# Patient Record
Sex: Female | Born: 1990 | Race: Black or African American | Hispanic: No | Marital: Single | Smoking: Former smoker
Health system: Southern US, Community
[De-identification: ages and names within clinical notes are randomized; demographics above are authoritative.]

## PROBLEM LIST (undated history)

## (undated) DIAGNOSIS — D649 Anemia, unspecified: Secondary | ICD-10-CM

## (undated) DIAGNOSIS — Z789 Other specified health status: Secondary | ICD-10-CM

## (undated) DIAGNOSIS — O99210 Obesity complicating pregnancy, unspecified trimester: Secondary | ICD-10-CM

## (undated) DIAGNOSIS — O30049 Twin pregnancy, dichorionic/diamniotic, unspecified trimester: Secondary | ICD-10-CM

## (undated) DIAGNOSIS — F1290 Cannabis use, unspecified, uncomplicated: Secondary | ICD-10-CM

## (undated) DIAGNOSIS — O99320 Drug use complicating pregnancy, unspecified trimester: Secondary | ICD-10-CM

## (undated) DIAGNOSIS — K219 Gastro-esophageal reflux disease without esophagitis: Secondary | ICD-10-CM

## (undated) DIAGNOSIS — A5901 Trichomonal vulvovaginitis: Secondary | ICD-10-CM

## (undated) DIAGNOSIS — Z98891 History of uterine scar from previous surgery: Secondary | ICD-10-CM

## (undated) HISTORY — PX: TONSILLECTOMY: SUR1361

## (undated) SURGERY — Surgical Case
Anesthesia: Spinal

---
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
## 2015-11-14 LAB — OB RESULTS CONSOLE HEPATITIS B SURFACE ANTIGEN: Hepatitis B Surface Ag: NEGATIVE

## 2015-11-14 LAB — OB RESULTS CONSOLE RUBELLA ANTIBODY, IGM: Rubella: IMMUNE

## 2015-11-14 LAB — OB RESULTS CONSOLE RPR: RPR: NONREACTIVE

## 2015-11-14 LAB — OB RESULTS CONSOLE VARICELLA ZOSTER ANTIBODY, IGG: VARICELLA IGG: IMMUNE

## 2016-02-13 LAB — OB RESULTS CONSOLE HIV ANTIBODY (ROUTINE TESTING): HIV: NONREACTIVE

## 2016-03-20 LAB — OB RESULTS CONSOLE GBS: GBS: POSITIVE

## 2016-04-08 ENCOUNTER — Encounter: Payer: Self-pay | Admitting: *Deleted

## 2016-04-08 ENCOUNTER — Observation Stay
Admission: EM | Admit: 2016-04-08 | Discharge: 2016-04-08 | Disposition: A | Discharge status: Home / Self Care | Payer: Medicaid Other | Attending: Obstetrics and Gynecology | Admitting: Obstetrics and Gynecology

## 2016-04-08 DIAGNOSIS — O479 False labor, unspecified: Secondary | ICD-10-CM

## 2016-04-08 DIAGNOSIS — Z3A40 40 weeks gestation of pregnancy: Secondary | ICD-10-CM | POA: Diagnosis not present

## 2016-04-08 DIAGNOSIS — O471 False labor at or after 37 completed weeks of gestation: Secondary | ICD-10-CM | POA: Diagnosis present

## 2016-04-08 HISTORY — DX: False labor, unspecified: O47.9

## 2016-04-08 NOTE — Discharge Summary (Signed)
Pt discharged to home with family in stable condition. Given d/c instructions on fetal kick counts and labor precautions. Verbalized understanding.

## 2016-04-08 NOTE — Final Progress Note (Signed)
Physician Final Progress Note  Patient ID: Jessica Davidson MRN: [id number] DOB/AGE: January 15, 1991 25 y.o.  Admit date: 04/08/2016 Admitting provider: Vena AustriaAndreas Jadence Kinlaw, MD Discharge date: 04/08/2016   Admission Diagnoses: irregular contractions  Discharge Diagnoses:  Active Problems:   Irregular contractions   Consults:none  Significant Findings/ Diagnostic Studies: none  Procedures: category I fetal tracing  Discharge Condition:  stable  Disposition: Final discharge disposition not confirmed  Diet: regular  Discharge Activity: no restrictions  Discharge Instructions    Discharge activity:  No Restrictions    Complete by:  As directed   Discharge diet:  No restrictions    Complete by:  As directed   Fetal Kick Count:  Lie on our left side for one hour after a meal, and count the number of times your baby kicks.  If it is less than 5 times, get up, move around and drink some juice.  Repeat the test 30 minutes later.  If it is still less than 5 kicks in an hour, notify your doctor.    Complete by:  As directed   LABOR:  When conractions begin, you should start to time them from the beginning of one contraction to the beginning  of the next.  When contractions are 5 - 10 minutes apart or less and have been regular for at least an hour, you should call your health care provider.    Complete by:  As directed   No sexual activity restrictions    Complete by:  As directed   Notify physician for bleeding from the vagina    Complete by:  As directed   Notify physician for blurring of vision or spots before the eyes    Complete by:  As directed   Notify physician for chills or fever    Complete by:  As directed   Notify physician for fainting spells, "black outs" or loss of consciousness    Complete by:  As directed   Notify physician for increase in vaginal discharge    Complete by:  As directed   Notify physician for leaking of fluid    Complete by:  As directed   Notify physician for pain  or burning when urinating    Complete by:  As directed   Notify physician for pelvic pressure (sudden increase)    Complete by:  As directed   Notify physician for severe or continued nausea or vomiting    Complete by:  As directed   Notify physician for sudden gushing of fluid from the vagina (with or without continued leaking)    Complete by:  As directed   Notify physician for sudden, constant, or occasional abdominal pain    Complete by:  As directed   Notify physician if baby moving less than usual    Complete by:  As directed       Medication List    TAKE these medications   multivitamin-prenatal 27-0.8 MG Tabs tablet Take 1 tablet by mouth daily at 12 noon.        Total time spent taking care of this patient:5 minutes  Signed: Lorrene ReidSTAEBLER, Dimond Crotty M 04/08/2016, 4:17 AM

## 2016-04-24 ENCOUNTER — Encounter: Payer: Self-pay | Admitting: *Deleted

## 2016-04-24 ENCOUNTER — Inpatient Hospital Stay
Admission: EM | Admit: 2016-04-24 | Discharge: 2016-04-29 | DRG: 765 | Disposition: A | Discharge status: Home / Self Care | Payer: Medicaid Other | Attending: Obstetrics and Gynecology | Admitting: Obstetrics and Gynecology

## 2016-04-24 DIAGNOSIS — Z8249 Family history of ischemic heart disease and other diseases of the circulatory system: Secondary | ICD-10-CM

## 2016-04-24 DIAGNOSIS — O480 Post-term pregnancy: Secondary | ICD-10-CM | POA: Diagnosis present

## 2016-04-24 DIAGNOSIS — D62 Acute posthemorrhagic anemia: Secondary | ICD-10-CM | POA: Diagnosis present

## 2016-04-24 DIAGNOSIS — Z9889 Other specified postprocedural states: Secondary | ICD-10-CM | POA: Diagnosis not present

## 2016-04-24 DIAGNOSIS — Z98891 History of uterine scar from previous surgery: Secondary | ICD-10-CM

## 2016-04-24 DIAGNOSIS — Z888 Allergy status to other drugs, medicaments and biological substances status: Secondary | ICD-10-CM

## 2016-04-24 DIAGNOSIS — O99824 Streptococcus B carrier state complicating childbirth: Secondary | ICD-10-CM | POA: Diagnosis present

## 2016-04-24 DIAGNOSIS — Z833 Family history of diabetes mellitus: Secondary | ICD-10-CM

## 2016-04-24 DIAGNOSIS — Z3A41 41 weeks gestation of pregnancy: Secondary | ICD-10-CM | POA: Diagnosis present

## 2016-04-24 DIAGNOSIS — O9081 Anemia of the puerperium: Secondary | ICD-10-CM | POA: Diagnosis present

## 2016-04-24 DIAGNOSIS — Z87891 Personal history of nicotine dependence: Secondary | ICD-10-CM

## 2016-04-24 HISTORY — DX: Other specified health status: Z78.9

## 2016-04-24 LAB — CBC
HEMATOCRIT: 32.9 % — AB (ref 35.0–47.0)
HEMOGLOBIN: 11.2 g/dL — AB (ref 12.0–16.0)
MCH: 28.1 pg (ref 26.0–34.0)
MCHC: 34.1 g/dL (ref 32.0–36.0)
MCV: 82.4 fL (ref 80.0–100.0)
Platelets: 269 10*3/uL (ref 150–440)
RBC: 3.99 MIL/uL (ref 3.80–5.20)
RDW: 13.7 % (ref 11.5–14.5)
WBC: 7.1 10*3/uL (ref 3.6–11.0)

## 2016-04-24 LAB — CHLAMYDIA/NGC RT PCR (ARMC ONLY)
Chlamydia Tr: NOT DETECTED
N gonorrhoeae: NOT DETECTED

## 2016-04-24 LAB — ABO/RH: ABO/RH(D): O POS

## 2016-04-24 LAB — ANTIBODY SCREEN: ANTIBODY SCREEN: NEGATIVE

## 2016-04-24 MED ORDER — LACTATED RINGERS IV SOLN: 500.0000 mL | INTRAVENOUS | Status: DC | PRN

## 2016-04-24 MED ORDER — SODIUM CHLORIDE 0.9 % IV SOLN
1.0000 g | INTRAVENOUS | Status: DC
  Administered 2016-04-25 (×5): 1 g via INTRAVENOUS
  Filled 2016-04-24 (×5): qty 1000

## 2016-04-24 MED ORDER — TERBUTALINE SULFATE 1 MG/ML IJ SOLN: 0.2500 mg | Freq: Once | INTRAMUSCULAR | Status: DC | PRN

## 2016-04-24 MED ORDER — AMMONIA AROMATIC IN INHA
RESPIRATORY_TRACT | Status: AC
  Filled 2016-04-24: qty 10

## 2016-04-24 MED ORDER — ZOLPIDEM TARTRATE 5 MG PO TABS
5.0000 mg | ORAL_TABLET | Freq: Every evening | ORAL | Status: DC | PRN
  Administered 2016-04-25: 5 mg via ORAL
  Filled 2016-04-24: qty 1

## 2016-04-24 MED ORDER — OXYTOCIN 40 UNITS IN LACTATED RINGERS INFUSION - SIMPLE MED: 2.5000 [IU]/h | INTRAVENOUS | Status: DC

## 2016-04-24 MED ORDER — ONDANSETRON HCL 4 MG/2ML IJ SOLN
4.0000 mg | Freq: Four times a day (QID) | INTRAMUSCULAR | Status: DC | PRN
  Administered 2016-04-26: 4 mg via INTRAVENOUS
  Filled 2016-04-24: qty 2

## 2016-04-24 MED ORDER — OXYTOCIN BOLUS FROM INFUSION: 500.0000 mL | Freq: Once | INTRAVENOUS | Status: DC

## 2016-04-24 MED ORDER — ACETAMINOPHEN 325 MG PO TABS: 650.0000 mg | ORAL_TABLET | ORAL | Status: DC | PRN

## 2016-04-24 MED ORDER — MISOPROSTOL 200 MCG PO TABS
ORAL_TABLET | ORAL | Status: AC
  Filled 2016-04-24: qty 4

## 2016-04-24 MED ORDER — LIDOCAINE HCL (PF) 1 % IJ SOLN
INTRAMUSCULAR | Status: AC
  Filled 2016-04-24: qty 30

## 2016-04-24 MED ORDER — BUTORPHANOL TARTRATE 1 MG/ML IJ SOLN
1.0000 mg | INTRAMUSCULAR | Status: DC | PRN
  Administered 2016-04-25 (×2): 1 mg via INTRAVENOUS
  Filled 2016-04-24 (×2): qty 1

## 2016-04-24 MED ORDER — DINOPROSTONE 10 MG VA INST
10.0000 mg | VAGINAL_INSERT | Freq: Once | VAGINAL | Status: AC
  Administered 2016-04-24: 10 mg via VAGINAL
  Filled 2016-04-24: qty 1

## 2016-04-24 MED ORDER — LACTATED RINGERS IV SOLN
INTRAVENOUS | Status: DC
  Administered 2016-04-24 – 2016-04-26 (×3): via INTRAVENOUS

## 2016-04-24 MED ORDER — OXYTOCIN 10 UNIT/ML IJ SOLN
INTRAMUSCULAR | Status: AC
  Filled 2016-04-24: qty 2

## 2016-04-24 MED ORDER — SODIUM CHLORIDE 0.9 % IV SOLN
2.0000 g | Freq: Once | INTRAVENOUS | Status: AC
  Administered 2016-04-24: 2 g via INTRAVENOUS
  Filled 2016-04-24: qty 2000

## 2016-04-24 NOTE — H&P (Signed)
Obstetric History and Physical  Jessica Davidson is a 25 y.o. G1P0 with Estimated Date of Delivery: 04/17/16  who presents at 7675w0d  presenting for postdates IOL. Patient states she has been having no contractions, no vaginal bleeding, intact membranes, with active fetal movement.    Prenatal Course Source of Care: [facility]  with onset of care at 17 weeks Pregnancy complications or risks: Late to care BMI 34  Patient Active Problem List   Diagnosis Date Noted  . Irregular contractions 04/08/2016   She plans to breastfeed She desires Nexplanon for postpartum contraception.   Prenatal labs and studies: ABO, Rh: O+  Antibody: Neg Rubella: Immune Varicella: Immune RPR:  NR HBsAg:  Neg HIV: Neg GC/CT: Neg/Neg GBS: Positive 1 hr Glucola: 139   Genetic screening: late to care  TDAP: UTD   Prenatal Transfer Tool   Past Medical History:  Diagnosis Date  . Medical history non-contributory     Past Surgical History:  Procedure Laterality Date  . TONSILLECTOMY      OB History  Gravida Para Term Preterm AB Living  1            SAB TAB Ectopic Multiple Live Births               # Outcome Date GA Lbr Len/2nd Weight Sex Delivery Anes PTL Lv  1 Current               Social History   Social History  . Marital status: Single    Spouse name: N/A  . Number of children: N/A  . Years of education: N/A   Social History Main Topics  . Smoking status: Former Smoker    Packs/day: 0.25  . Smokeless tobacco: Former NeurosurgeonUser  . Alcohol use No  . Drug use: No  . Sexual activity: No   Other Topics Concern  . None   Social History Narrative  . None    Family History  Problem Relation Age of Onset  . Diabetes Mother   . Hypertension Mother   . Hypertension Father     Prescriptions Prior to Admission  Medication Sig Dispense Refill Last Dose  . acetaminophen (TYLENOL) 500 MG tablet Take 500 mg by mouth every 6 (six) hours as needed.   Past Week at Unknown time  . Prenatal  Vit-Fe Fumarate-FA (MULTIVITAMIN-PRENATAL) 27-0.8 MG TABS tablet Take 1 tablet by mouth daily at 12 noon.   04/24/2016 at Unknown time    No Known Allergies  Review of Systems: Negative except for what is mentioned in HPI.  Physical Exam: BP 114/67 (BP Location: Left Arm)   Pulse 86   Temp 98 F (36.7 C) (Oral)   Resp 16   Ht 5\' 7"  (1.702 m)   Wt 248 lb (112.5 kg)   BMI 38.84 kg/m  GENERAL: Well-developed, well-nourished female in no acute distress.  LUNGS: Unlabored breathing ABDOMEN: Soft, nontender, nondistended, gravid. EXTREMITIES: Nontender, no edema Cervical Exam: Dilatation 1 cm   Effacement 20 %   Station -3   Presentation: cephalic FHT: Category: 1 Baseline rate 135 bpm   Variability moderate  Accelerations present   Decelerations none Contractions: irregular   Pertinent Labs/Studies:   No results found for this or any previous visit (from the past 24 hour(s)).  Assessment : IUP at 3475w0d IOL for postdates  Plan: Admit for labor and Antibiotics for GBS prophylaxis  Cervidil for cervical ripening

## 2016-04-25 MED ORDER — OXYTOCIN 40 UNITS IN LACTATED RINGERS INFUSION - SIMPLE MED
1.0000 m[IU]/min | INTRAVENOUS | Status: DC
  Administered 2016-04-25: 3 m[IU]/min via INTRAVENOUS

## 2016-04-25 MED ORDER — SODIUM CHLORIDE 0.9 % IV SOLN
INTRAVENOUS | Status: AC
  Filled 2016-04-25: qty 1000

## 2016-04-25 MED ORDER — TERBUTALINE SULFATE 1 MG/ML IJ SOLN: 0.2500 mg | Freq: Once | INTRAMUSCULAR | Status: DC | PRN

## 2016-04-25 MED ORDER — SODIUM CHLORIDE FLUSH 0.9 % IV SOLN
INTRAVENOUS | Status: AC
  Filled 2016-04-25: qty 10

## 2016-04-25 MED ORDER — SODIUM CHLORIDE 0.9 % IJ SOLN
INTRAMUSCULAR | Status: AC
  Filled 2016-04-25: qty 50

## 2016-04-25 MED ORDER — BUTORPHANOL TARTRATE 1 MG/ML IJ SOLN: 1.0000 mg | INTRAMUSCULAR | Status: DC | PRN

## 2016-04-25 MED ORDER — OXYTOCIN 40 UNITS IN LACTATED RINGERS INFUSION - SIMPLE MED
INTRAVENOUS | Status: AC
  Administered 2016-04-25: 1 m[IU]/min
  Filled 2016-04-25: qty 1000

## 2016-04-25 NOTE — Progress Notes (Signed)
Subjective:  Doing well, somewhat tearful this morning with no cervical change.  Is feeling some contractions  Objective:   Vitals: Blood pressure 110/68, pulse 81, temperature 97.9 F (36.6 C), temperature source Oral, resp. rate 20, height 5\' 7"  (1.702 m), weight 248 lb (112.5 kg). General:  Abdomen: Cervical Exam:  Dilation: 1 Effacement (%): Thick, 30, 40 Cervical Position: Posterior Station: -2 Presentation: Vertex Exam by:: JLM  FHT: 130, moderate, +accels, no decels Toco: q5-157min  Results for orders placed or performed during the hospital encounter of 04/24/16 (from the past 24 hour(s))  CBC     Status: Abnormal   Collection Time: 04/24/16  9:14 PM  Result Value Ref Range   WBC 7.1 3.6 - 11.0 K/uL   RBC 3.99 3.80 - 5.20 MIL/uL   Hemoglobin 11.2 (L) 12.0 - 16.0 g/dL   HCT 16.132.9 (L) 09.635.0 - 04.547.0 %   MCV 82.4 80.0 - 100.0 fL   MCH 28.1 26.0 - 34.0 pg   MCHC 34.1 32.0 - 36.0 g/dL   RDW 40.913.7 81.111.5 - 91.414.5 %   Platelets 269 150 - 440 K/uL  ABO/Rh     Status: None   Collection Time: 04/24/16  9:14 PM  Result Value Ref Range   ABO/RH(D) O POS   Antibody screen     Status: None   Collection Time: 04/24/16  9:14 PM  Result Value Ref Range   Antibody Screen NEG   Chlamydia/NGC rt PCR ([facility] only)     Status: None   Collection Time: 04/24/16  9:31 PM  Result Value Ref Range   Specimen source GC/Chlam URINE, RANDOM    Chlamydia Tr NOT DETECTED NOT DETECTED   N gonorrhoeae NOT DETECTED NOT DETECTED    Assessment:   25 y.o. G1P0 3672w1d IOL for postdates  Plan:   1) Labor - start pitocin this AM, will place foley once in contraction pattern.  May shower and eat prior to starting pitocin  2) Fetus - cat I tracing

## 2016-04-25 NOTE — Progress Notes (Signed)
[facility]  Cervidil removed at 9:50.  AMS letting [name] get up to shower and have a light breakfast.

## 2016-04-25 NOTE — Progress Notes (Signed)
Subjective:  Comfortable  Objective:   Vitals: Blood pressure 110/68, pulse 81, temperature 97.9 F (36.6 C), temperature source Oral, resp. rate 20, height 5\' 7"  (1.702 m), weight 248 lb (112.5 kg). General: Sleeping Abdomen: Gravid, non-tender Cervical Exam:  Dilation: 1 Effacement (%): Thick, 30, 40 Cervical Position: Posterior Station: -2 Presentation: Vertex Exam by:: JLM  FHT: 140, moderate, +accels, no decels Toco: intermittent  Results for orders placed or performed during the hospital encounter of 04/24/16 (from the past 24 hour(s))  CBC     Status: Abnormal   Collection Time: 04/24/16  9:14 PM  Result Value Ref Range   WBC 7.1 3.6 - 11.0 K/uL   RBC 3.99 3.80 - 5.20 MIL/uL   Hemoglobin 11.2 (L) 12.0 - 16.0 g/dL   HCT 45.432.9 (L) 09.835.0 - 11.947.0 %   MCV 82.4 80.0 - 100.0 fL   MCH 28.1 26.0 - 34.0 pg   MCHC 34.1 32.0 - 36.0 g/dL   RDW 14.713.7 82.911.5 - 56.214.5 %   Platelets 269 150 - 440 K/uL  ABO/Rh     Status: None   Collection Time: 04/24/16  9:14 PM  Result Value Ref Range   ABO/RH(D) O POS   Antibody screen     Status: None   Collection Time: 04/24/16  9:14 PM  Result Value Ref Range   Antibody Screen NEG   Chlamydia/NGC rt PCR ([facility] only)     Status: None   Collection Time: 04/24/16  9:31 PM  Result Value Ref Range   Specimen source GC/Chlam URINE, RANDOM    Chlamydia Tr NOT DETECTED NOT DETECTED   N gonorrhoeae NOT DETECTED NOT DETECTED    Assessment:   25 y.o. G1P0 5553w1d postdates IOL  Plan:   1) Labor - cervical foley placed, continue to titrate up on pitocin  2) Fetus - cat I tracing

## 2016-04-26 ENCOUNTER — Inpatient Hospital Stay: Payer: Medicaid Other | Admitting: Anesthesiology

## 2016-04-26 ENCOUNTER — Encounter
Admission: EM | Disposition: A | Discharge status: Home / Self Care | Payer: Self-pay | Source: Home / Self Care | Attending: Obstetrics and Gynecology

## 2016-04-26 LAB — RPR: RPR: NONREACTIVE

## 2016-04-26 LAB — PLATELET COUNT: PLATELETS: 232 10*3/uL (ref 150–440)

## 2016-04-26 SURGERY — Surgical Case
Anesthesia: Spinal | Wound class: Clean Contaminated

## 2016-04-26 MED ORDER — LACTATED RINGERS IV SOLN
INTRAVENOUS | Status: DC
  Administered 2016-04-26 – 2016-04-27 (×3): via INTRAVENOUS

## 2016-04-26 MED ORDER — TETANUS-DIPHTH-ACELL PERTUSSIS 5-2.5-18.5 LF-MCG/0.5 IM SUSP: 0.5000 mL | Freq: Once | INTRAMUSCULAR | Status: DC

## 2016-04-26 MED ORDER — IBUPROFEN 600 MG PO TABS
600.0000 mg | ORAL_TABLET | Freq: Four times a day (QID) | ORAL | Status: DC
  Administered 2016-04-27 – 2016-04-29 (×9): 600 mg via ORAL
  Filled 2016-04-26 (×10): qty 1

## 2016-04-26 MED ORDER — FENTANYL CITRATE (PF) 100 MCG/2ML IJ SOLN: 25.0000 ug | INTRAMUSCULAR | Status: DC | PRN

## 2016-04-26 MED ORDER — KETOROLAC TROMETHAMINE 30 MG/ML IJ SOLN
30.0000 mg | Freq: Four times a day (QID) | INTRAMUSCULAR | Status: AC | PRN
  Administered 2016-04-26 – 2016-04-27 (×2): 30 mg via INTRAVENOUS
  Filled 2016-04-26 (×2): qty 1

## 2016-04-26 MED ORDER — SOD CITRATE-CITRIC ACID 500-334 MG/5ML PO SOLN
ORAL | Status: AC
  Administered 2016-04-26: 30 mL via ORAL
  Filled 2016-04-26: qty 15

## 2016-04-26 MED ORDER — BUPIVACAINE HCL 0.5 % IJ SOLN: 50.0000 mL | Freq: Once | INTRAMUSCULAR | Status: DC

## 2016-04-26 MED ORDER — ONDANSETRON HCL 4 MG/2ML IJ SOLN
INTRAMUSCULAR | Status: DC | PRN
  Administered 2016-04-26: 4 mg via INTRAVENOUS

## 2016-04-26 MED ORDER — ONDANSETRON HCL 4 MG/2ML IJ SOLN: 4.0000 mg | Freq: Once | INTRAMUSCULAR | Status: DC | PRN

## 2016-04-26 MED ORDER — SIMETHICONE 80 MG PO CHEW: 80.0000 mg | CHEWABLE_TABLET | ORAL | Status: DC

## 2016-04-26 MED ORDER — SIMETHICONE 80 MG PO CHEW
80.0000 mg | CHEWABLE_TABLET | Freq: Three times a day (TID) | ORAL | Status: DC
  Administered 2016-04-26 – 2016-04-29 (×8): 80 mg via ORAL
  Filled 2016-04-26 (×8): qty 1

## 2016-04-26 MED ORDER — LACTATED RINGERS IV SOLN
INTRAVENOUS | Status: DC | PRN
  Administered 2016-04-26: 06:00:00 via INTRAVENOUS

## 2016-04-26 MED ORDER — CEFAZOLIN SODIUM-DEXTROSE 2-4 GM/100ML-% IV SOLN
INTRAVENOUS | Status: AC
  Administered 2016-04-26: 2 g via INTRAVENOUS
  Filled 2016-04-26: qty 100

## 2016-04-26 MED ORDER — PRENATAL MULTIVITAMIN CH
1.0000 | ORAL_TABLET | Freq: Every day | ORAL | Status: DC
  Administered 2016-04-26 – 2016-04-29 (×4): 1 via ORAL
  Filled 2016-04-26 (×4): qty 1

## 2016-04-26 MED ORDER — OXYCODONE-ACETAMINOPHEN 5-325 MG PO TABS
2.0000 | ORAL_TABLET | ORAL | Status: DC | PRN
Start: 2016-04-27 — End: 2016-04-29

## 2016-04-26 MED ORDER — PHENYLEPHRINE HCL 10 MG/ML IJ SOLN
INTRAMUSCULAR | Status: DC | PRN
  Administered 2016-04-26 (×3): 100 ug via INTRAVENOUS

## 2016-04-26 MED ORDER — WITCH HAZEL-GLYCERIN EX PADS: 1.0000 "application " | MEDICATED_PAD | CUTANEOUS | Status: DC | PRN

## 2016-04-26 MED ORDER — SENNOSIDES-DOCUSATE SODIUM 8.6-50 MG PO TABS
2.0000 | ORAL_TABLET | ORAL | Status: DC
Start: 2016-04-27 — End: 2016-04-29
  Administered 2016-04-27 – 2016-04-29 (×3): 2 via ORAL
  Filled 2016-04-26 (×3): qty 2

## 2016-04-26 MED ORDER — SIMETHICONE 80 MG PO CHEW: 80.0000 mg | CHEWABLE_TABLET | ORAL | Status: DC | PRN

## 2016-04-26 MED ORDER — ACETAMINOPHEN 325 MG PO TABS: 650.0000 mg | ORAL_TABLET | ORAL | Status: DC | PRN

## 2016-04-26 MED ORDER — MORPHINE SULFATE (PF) 0.5 MG/ML IJ SOLN
INTRAMUSCULAR | Status: DC | PRN
  Administered 2016-04-26: .2 mg via INTRATHECAL

## 2016-04-26 MED ORDER — OXYCODONE-ACETAMINOPHEN 5-325 MG PO TABS
1.0000 | ORAL_TABLET | ORAL | Status: DC | PRN
  Administered 2016-04-27: 1 via ORAL
  Filled 2016-04-26: qty 1

## 2016-04-26 MED ORDER — DIBUCAINE 1 % RE OINT: 1.0000 "application " | TOPICAL_OINTMENT | RECTAL | Status: DC | PRN

## 2016-04-26 MED ORDER — SOD CITRATE-CITRIC ACID 500-334 MG/5ML PO SOLN
30.0000 mL | ORAL | Status: AC
  Administered 2016-04-26: 30 mL via ORAL

## 2016-04-26 MED ORDER — OXYTOCIN 40 UNITS IN LACTATED RINGERS INFUSION - SIMPLE MED
INTRAVENOUS | Status: DC | PRN
  Administered 2016-04-26: 399 mL via INTRAVENOUS
  Administered 2016-04-26: 1 mL via INTRAVENOUS

## 2016-04-26 MED ORDER — DEXTROSE 5 % IV SOLN
500.0000 mg | INTRAVENOUS | Status: DC
  Administered 2016-04-26: 500 mg via INTRAVENOUS
  Filled 2016-04-26 (×2): qty 500

## 2016-04-26 MED ORDER — BUPIVACAINE 0.25 % ON-Q PUMP DUAL CATH 400 ML
400.0000 mL | INJECTION | Status: DC
  Filled 2016-04-26: qty 400

## 2016-04-26 MED ORDER — KETOROLAC TROMETHAMINE 30 MG/ML IJ SOLN: 30.0000 mg | Freq: Four times a day (QID) | INTRAMUSCULAR | Status: AC | PRN

## 2016-04-26 MED ORDER — BUPIVACAINE 0.5 % ON-Q PUMP DUAL CATH 300 ML
INJECTION | Status: DC | PRN
  Administered 2016-04-26: 10 mL

## 2016-04-26 MED ORDER — FENTANYL CITRATE (PF) 100 MCG/2ML IJ SOLN
INTRAMUSCULAR | Status: DC | PRN
  Administered 2016-04-26: 20 ug via INTRATHECAL

## 2016-04-26 MED ORDER — OXYTOCIN 40 UNITS IN LACTATED RINGERS INFUSION - SIMPLE MED: 2.5000 [IU]/h | INTRAVENOUS | Status: AC

## 2016-04-26 MED ORDER — COCONUT OIL OIL: 1.0000 "application " | TOPICAL_OIL | Status: DC | PRN

## 2016-04-26 MED ORDER — CEFAZOLIN SODIUM-DEXTROSE 2-4 GM/100ML-% IV SOLN
2.0000 g | INTRAVENOUS | Status: AC
  Administered 2016-04-26: 2 g via INTRAVENOUS

## 2016-04-26 MED ORDER — MENTHOL 3 MG MT LOZG
1.0000 | LOZENGE | OROMUCOSAL | Status: DC | PRN
  Filled 2016-04-26: qty 9

## 2016-04-26 MED ORDER — BUPIVACAINE IN DEXTROSE 0.75-8.25 % IT SOLN
INTRATHECAL | Status: DC | PRN
  Administered 2016-04-26: 1.6 mL via INTRATHECAL

## 2016-04-26 MED ORDER — DIPHENHYDRAMINE HCL 25 MG PO CAPS: 25.0000 mg | ORAL_CAPSULE | Freq: Four times a day (QID) | ORAL | Status: DC | PRN

## 2016-04-26 SURGICAL SUPPLY — 27 items
BAG COUNTER SPONGE EZ (MISCELLANEOUS) ×4 IMPLANT
CANISTER SUCT 3000ML (MISCELLANEOUS) ×3 IMPLANT
CATH KIT ON-Q SILVERSOAK 5IN (CATHETERS) ×6 IMPLANT
CHLORAPREP W/TINT 26ML (MISCELLANEOUS) ×6 IMPLANT
CLOSURE WOUND 1/2 X4 (GAUZE/BANDAGES/DRESSINGS) ×1
COUNTER SPONGE BAG EZ (MISCELLANEOUS) ×2
DRSG TELFA 3X8 NADH (GAUZE/BANDAGES/DRESSINGS) ×3 IMPLANT
ELECT CAUTERY BLADE 6.4 (BLADE) ×3 IMPLANT
ELECT REM PT RETURN 9FT ADLT (ELECTROSURGICAL) ×3
ELECTRODE REM PT RTRN 9FT ADLT (ELECTROSURGICAL) ×1 IMPLANT
GAUZE SPONGE 4X4 12PLY STRL (GAUZE/BANDAGES/DRESSINGS) ×3 IMPLANT
GLOVE BIO SURGEON STRL SZ7 (GLOVE) ×9 IMPLANT
GLOVE INDICATOR 7.5 STRL GRN (GLOVE) ×9 IMPLANT
GOWN STRL REUS W/ TWL LRG LVL3 (GOWN DISPOSABLE) ×3 IMPLANT
GOWN STRL REUS W/TWL LRG LVL3 (GOWN DISPOSABLE) ×6
LIQUID BAND (GAUZE/BANDAGES/DRESSINGS) ×9 IMPLANT
NS IRRIG 1000ML POUR BTL (IV SOLUTION) ×3 IMPLANT
PACK C SECTION AR (MISCELLANEOUS) ×3 IMPLANT
PAD OB MATERNITY 4.3X12.25 (PERSONAL CARE ITEMS) ×3 IMPLANT
PAD PREP 24X41 OB/GYN DISP (PERSONAL CARE ITEMS) ×3 IMPLANT
STAPLER INSORB 30 2030 C-SECTI (MISCELLANEOUS) ×3 IMPLANT
STRIP CLOSURE SKIN 1/2X4 (GAUZE/BANDAGES/DRESSINGS) ×2 IMPLANT
SUT MNCRL AB 4-0 PS2 18 (SUTURE) IMPLANT
SUT PDS AB 1 TP1 96 (SUTURE) ×6 IMPLANT
SUT VIC AB 0 CTX 36 (SUTURE) ×6
SUT VIC AB 0 CTX36XBRD ANBCTRL (SUTURE) ×3 IMPLANT
SUT VIC AB 2-0 CT1 36 (SUTURE) IMPLANT

## 2016-04-26 NOTE — Progress Notes (Signed)
Subjective:  Still feeling gas pain (these correlate with contractions)  Objective:   Vitals: Blood pressure (!) 96/46, pulse 69, temperature 97.9 F (36.6 C), temperature source Oral, resp. rate 20, height 5\' 7"  (1.702 m), weight 248 lb (112.5 kg). General: Uncomfortable with contractions Abdomen: gravid, non-tender Cervical Exam:  Dilation: 3 Effacement (%): 70 Cervical Position: Posterior Station: -3 Presentation: Vertex Exam by:: Treyten Monestime  FHT: 140, moderate variability, recurrent variables early decelerations Toco: q452min  No results found for this or any previous visit (from the past 24 hour(s)).  Assessment:   25 y.o. G1P0 4758w2d IOL postdates  Plan:   1) Labor - stop pitocin, amnioinfusion 500mL bolus over 1-hr, restart pitocin if reassuring monitoring - proceed with epidural placement  2) Fetus - cat II tracin

## 2016-04-26 NOTE — Progress Notes (Signed)
Subjective:    Objective:   Vitals: Blood pressure (!) 96/46, pulse 69, temperature 97.9 F (36.6 C), temperature source Oral, resp. rate 20, height 5\' 7"  (1.702 m), weight 248 lb (112.5 kg). General:  Abdomen: Cervical Exam:  Dilation: 3 Effacement (%): 70 Cervical Position: Posterior Station: -3 Presentation: Vertex Exam by:: Chigozie Basaldua  FHT: 150, moderate, no accels, variables with every contraction Toco: q557min  No results found for this or any previous visit (from the past 24 hour(s)).  Assessment:   25 y.o. G1P0 5959w2d IOL postdates with fetal intolerance to labor  Plan:   1) Labor - recurrent variable have not responded to position changes, amnio-infusion.  Spaced out with stopping pitocin but given early in labor course discussed implications and will proceed with primary c-section for delivery  2) Fetus - cat II tracing

## 2016-04-26 NOTE — Anesthesia Preprocedure Evaluation (Signed)
Anesthesia Evaluation  Patient identified by MRN, date of birth, ID band Patient awake    Reviewed: Allergy & Precautions, H&P , NPO status , Patient's Chart, lab work & pertinent test results, reviewed documented beta blocker date and time Preop documentation limited or incomplete due to emergent nature of procedure.  Airway Mallampati: II  TM Distance: >3 FB Neck ROM: full    Dental no notable dental hx.    Pulmonary neg pulmonary ROS, former smoker,    Pulmonary exam normal breath sounds clear to auscultation       Cardiovascular Exercise Tolerance: Good negative cardio ROS   Rhythm:regular Rate:Normal     Neuro/Psych negative neurological ROS  negative psych ROS   GI/Hepatic negative GI ROS, Neg liver ROS,   Endo/Other  negative endocrine ROS  Renal/GU negative Renal ROS  negative genitourinary   Musculoskeletal   Abdominal   Peds  Hematology negative hematology ROS (+)   Anesthesia Other Findings   Reproductive/Obstetrics negative OB ROS                             Anesthesia Physical Anesthesia Plan  ASA: II and emergent  Anesthesia Plan: Spinal   Post-op Pain Management:    Induction:   Airway Management Planned:   Additional Equipment:   Intra-op Plan:   Post-operative Plan:   Informed Consent: I have reviewed the patients History and Physical, chart, labs and discussed the procedure including the risks, benefits and alternatives for the proposed anesthesia with the patient or authorized representative who has indicated his/her understanding and acceptance.   Dental Advisory Given  Plan Discussed with: CRNA  Anesthesia Plan Comments:         Anesthesia [place] Evaluation

## 2016-04-26 NOTE — Anesthesia Procedure Notes (Signed)
Spinal  Patient location during procedure: OB Staffing Anesthesiologist: Yevette EdwardsADAMS, JAMES G Performed: anesthesiologist  Preanesthetic Checklist Completed: patient identified, site marked, surgical consent, pre-op evaluation, timeout performed, IV checked, risks and benefits discussed and monitors and equipment checked Spinal Block Patient position: sitting Prep: Betadine Patient monitoring: heart rate, cardiac monitor, continuous pulse ox and blood pressure Approach: midline Location: L4-5 Injection technique: single-shot Needle Needle type: Whitacre  Needle gauge: 24 G Needle length: 10 cm Assessment Sensory level: T6 Additional Notes SAB placed without difficulty.  +CSF No Heme.  No paresthesia.  VSST and tolerated well.  JA

## 2016-04-26 NOTE — Transfer of Care (Signed)
Immediate Anesthesia Transfer of Care Note  Patient: Jessica Davidson  Procedure(s) Performed: Procedure(s): CESAREAN SECTION (N/A)  Patient Location: PACU and Antenatal  Anesthesia Type:Spinal  Level of Consciousness: awake  Airway & Oxygen Therapy: Patient Spontanous Breathing  Post-op Assessment: Report given to RN and Post -op Vital signs reviewed and stable  Post vital signs: Reviewed and stable  Last Vitals:  Vitals:   04/25/16 1933 04/26/16 0011  BP:    Pulse:    Resp: 18 20  Temp: 36.7 C 36.6 C    Last Pain:  Vitals:   04/26/16 0011  TempSrc: Oral  PainSc:          Complications: No apparent anesthesia complications

## 2016-04-26 NOTE — Op Note (Signed)
Preoperative Diagnosis: 1) 25 y.o. G1P0 at 6696w2d 2) Fetal intolerance to labor  Postoperative Diagnosis: 1) 25 y.o. G1P0 at 1596w2d 2) Fetal intolerance to labor  Operation Performed: Primary low transverse C-section via pfannenstiel skin incision  Indication: fetal intolerance to labor  Anesthesia: Spinal  Primary Surgeon: Vena AustriaAndreas Keaton Beichner, MD  Assistant: Epimenio SarinShelby Street  Preoperative Antibiotics: 2g ancef and 500mg  azithromycin  Estimated Blood Loss: 800mL  IV Fluids: 1200mL  Drains or Tubes: Foley to gravity drainage, ON-Q catheter system  Implants: none  Specimens Removed: none  Complications: none  Intraoperative Findings:  Normal tubes ovaries and uterus.  Delivery resulted in the birth of a liveborn female, APGAR (1 MIN): 9, APGAR (5 MINS): 10, weight  7lbs 9oz  Patient Condition: stable  Procedure in Detail:  Patient was taken to the operating room were she was administered regional anesthesia.  She was positioned in the supine position, prepped and draped in the  Usual sterile fashion.  Prior to proceeding with the case a time out was performed and the level of anesthetic was checked and noted to be adequate.  Utilizing the scalpel a pfannenstiel skin incision was made 2cm above the pubic symphysis and carried down sharply to the the level of the rectus fascia.  The fascia was incised in the midline using the scalpel and then extended using mayo scissors.  The superior border of the rectus fascia was grasped with two Kocher clamps and the underlying rectus muscles were dissected of the fascia using blunt dissection.  The median raphae was incised using Mayo scissors.   The inferior border of the rectus fascia was dissected of the rectus muscles in a similar fashion.  The midline was identified, the peritoneum was entered bluntly and expanded using manual tractions.  The uterus was noted to be in a none rotated position.  Next the bladder blade was placed retracting the  bladder caudally.  A bladder flap was not created.  A low transverse incision was scored on the lower uterine segment.  The hysterotomy was entered bluntly using the operators finger.  The hysterotomy incision was extended using manual traction.  The operators hand was placed within the hysterotomy position noting the fetus to be within the OA position.  The vertex was grasped, flexed, brought to the incision, and delivered a traumatically using fundal pressure.  The remainder of the body delivered with ease.  The infant was suctioned, cord was clamped and cut before handing off to the awaiting neonatologist.  The placenta was delivered using manual extraction.  The uterus was exteriorized, wiped clean of clots and debris using two moist laps.  The hysterotomy was closed using a two layer closure of 0 Vicryl, with the first being a running locked, the second a vertical imbricating.  The uterus was returned to the abdomen.  The peritoneal gutters were wiped clean of clots and debris using two moist laps.  The hysterotomy incision was re-inspected noted to be hemostatic.  The rectus muscles were re-approximated in the midline using a single 2-0 Vicryl mattress stitch.  The rectus muscles were inspected noted to be hemostatic.  The superior border of the rectus fascia was grasped with a Kocher clamp.  The ON-Q trocars were then placed 4cm above the superior border of the incision and tunneled subfascially.  The introducers were removed and the catheters were threaded through the sleeves after which the sleeves were removed.  The fascia was closed using a looped #1 PDS in a running fashion taking  1cm by 1cm bites.  The subcutaneous tissue was irrigated using warm saline, hemostasis achieved using the bovie.  The subcutaneous dead space was less than 3cm and was not closed.  The skin was closed using insorb staples.  Sponge needle and instrument counts were corrects times two.  The patient tolerated the procedure well  and was taken to the recovery room in stable condition.

## 2016-04-26 NOTE — Progress Notes (Signed)
Subjective:  Feeling "gas pains"  Objective:   Vitals: Blood pressure (!) 96/46, pulse 69, temperature 98 F (36.7 C), temperature source Oral, resp. rate 18, height 5\' 7"  (1.702 m), weight 248 lb (112.5 kg). General:  Abdomen: Cervical Exam:  Dilation: 3 Effacement (%): 90 Cervical Position: Posterior Station: -3 Presentation: Vertex Exam by:: gaccione, rn  FHT: 120, moderate, +accels, some variables in last 30 minutes Toco: not tracing well (patient sitting up complaining of gas pains)  IUPC placed  No results found for this or any previous visit (from the past 24 hour(s)).  Assessment:   25 y.o. G1P0 3566w2d IOL for postdates  Plan:   1) Labor - foley came out and spontaneous SROM, about 3/90/-3  2) Fetus - cat I tacing, titrate pitocin pending results of IUPC

## 2016-04-26 NOTE — Progress Notes (Signed)
Contractions have stopped, as have variable, heart rate 130, moderate variability, no accels, rare short variable.  Emergent case has delayed C-section given reassuring monitoring at present with pitocin stopped.

## 2016-04-27 ENCOUNTER — Encounter: Payer: Self-pay | Admitting: Obstetrics and Gynecology

## 2016-04-27 LAB — CBC
HCT: 25.3 % — ABNORMAL LOW (ref 35.0–47.0)
HEMOGLOBIN: 8.5 g/dL — AB (ref 12.0–16.0)
MCH: 28.1 pg (ref 26.0–34.0)
MCHC: 33.5 g/dL (ref 32.0–36.0)
MCV: 83.9 fL (ref 80.0–100.0)
Platelets: 207 10*3/uL (ref 150–440)
RBC: 3.02 MIL/uL — AB (ref 3.80–5.20)
RDW: 13.9 % (ref 11.5–14.5)
WBC: 9 10*3/uL (ref 3.6–11.0)

## 2016-04-27 NOTE — Anesthesia Postprocedure Evaluation (Signed)
Anesthesia Post Note  Patient: Jessica Davidson  Procedure(s) Performed: Procedure(s) (LRB): CESAREAN SECTION (N/A)  Patient location during evaluation: Mother Baby Anesthesia Type: Spinal Level of consciousness: awake Pain management: satisfactory to patient Vital Signs Assessment: post-procedure vital signs reviewed and stable Respiratory status: spontaneous breathing, nonlabored ventilation and respiratory function stable Cardiovascular status: stable Postop Assessment: no signs of nausea or vomiting, adequate PO intake and patient able to bend at knees Anesthetic complications: no    Last Vitals:  Vitals:   04/26/16 2339 04/27/16 0407  BP: (!) 104/52 98/60  Pulse: 81 70  Resp: 18 20  Temp: 37.2 C 36.8 C    Last Pain:  Vitals:   04/27/16 0420  TempSrc:   PainSc: Asleep                 Jessica Davidson

## 2016-04-27 NOTE — Progress Notes (Signed)
Admit Date: 04/24/2016 Today's Date: 04/27/2016  Subjective: Postpartum Day 1: Cesarean Delivery Patient reports incisional pain, tolerating PO and no problems voiding.    Objective: Vital signs in last 24 hours: Temp:  [98 F (36.7 C)-99 F (37.2 C)] 98.3 F (36.8 C) (09/29 0811) Pulse Rate:  [66-81] 66 (09/29 0811) Resp:  [18-20] 18 (09/29 0811) BP: (93-110)/(51-68) 96/61 (09/29 0811) SpO2:  [98 %-100 %] 100 % (09/29 0407)  Physical Exam:  General: alert, cooperative and no distress Lochia: appropriate Uterine Fundus: firm Incision: healing well DVT Evaluation: No evidence of DVT seen on physical exam.   Recent Labs  04/24/16 2114 04/27/16 0620  HGB 11.2* 8.5*  HCT 32.9* 25.3*    Assessment/Plan: Status post Cesarean section. Doing well postoperatively.  Continue current care. Fe for anemia, monitor for s/sx severe anemia Ambulate and oral pain meds Plans to breast and bottle feed  Letitia Libraobert Paul Raetta Agostinelli 04/27/2016, 9:39 AM

## 2016-04-27 NOTE — Anesthesia Post-op Follow-up Note (Signed)
  Anesthesia Pain Follow-up Note  Patient: Jessica Davidson  Day #: 1  Date of Follow-up: 04/27/2016 Time: 8:04 AM  Last Vitals:  Vitals:   04/26/16 2339 04/27/16 0407  BP: (!) 104/52 98/60  Pulse: 81 70  Resp: 18 20  Temp: 37.2 C 36.8 C    Level of Consciousness: alert  Pain: none   Side Effects:None  Catheter Site Exam:clean     Plan: D/C from anesthesia care  Marlana SalvageSandra Altamese Deguire

## 2016-04-28 NOTE — Progress Notes (Signed)
  Subjective:  Doing well no concerns, minimal lochia.  Pain well controlled  Objective:  Blood pressure 106/66, pulse 77, temperature 98.2 F (36.8 C), resp. rate 20, height 5\' 7"  (1.702 m), weight 248 lb (112.5 kg), SpO2 100 %, unknown if currently breastfeeding.  General: NAD Pulmonary: no increased work of breathing Abdomen: non-distended, non-tender, fundus firm at level of umbilicus Incision: D/C/I Extremities: no edema, no erythema, no tenderness  Results for orders placed or performed during the hospital encounter of 04/24/16 (from the past 72 hour(s))  Platelet count     Status: None   Collection Time: 04/26/16  2:01 AM  Result Value Ref Range   Platelets 232 150 - 440 K/uL  CBC     Status: Abnormal   Collection Time: 04/27/16  6:20 AM  Result Value Ref Range   WBC 9.0 3.6 - 11.0 K/uL   RBC 3.02 (L) 3.80 - 5.20 MIL/uL   Hemoglobin 8.5 (L) 12.0 - 16.0 g/dL   HCT 40.925.3 (L) 81.135.0 - 91.447.0 %   MCV 83.9 80.0 - 100.0 fL   MCH 28.1 26.0 - 34.0 pg   MCHC 33.5 32.0 - 36.0 g/dL   RDW 78.213.9 95.611.5 - 21.314.5 %   Platelets 207 150 - 440 K/uL     Assessment:   25 y.o. G1P0 postoperativeday #2 1LTCS for fetal intolerance of labor   Plan:  1) Acute blood loss anemia - hemodynamically stable and asymptomatic - po ferrous sulfate  2) --/--/O POS (09/26 2114) / Rubella Immune (04/17 0000) / Varicella Immune  3) TDAP status UTD  4) Disposition anticipated discharge POD3

## 2016-04-29 MED ORDER — IBUPROFEN 600 MG PO TABS: 600.0000 mg | ORAL_TABLET | Freq: Four times a day (QID) | ORAL | 0 refills | Status: DC

## 2016-04-29 MED ORDER — FERROUS SULFATE 325 (65 FE) MG PO TABS: 325.0000 mg | ORAL_TABLET | Freq: Every day | ORAL | 1 refills | Status: DC

## 2016-04-29 MED ORDER — OXYCODONE-ACETAMINOPHEN 5-325 MG PO TABS: 1.0000 | ORAL_TABLET | ORAL | 0 refills | Status: DC | PRN

## 2016-04-29 NOTE — Progress Notes (Signed)
Discharge instructions reviewed with patient.  All questions answered.  PT to schedule follow up appointment .

## 2016-04-29 NOTE — Discharge Summary (Signed)
Obstetric Discharge Summary Reason for Admission: induction of labor Prenatal Procedures: none Intrapartum Procedures: cesarean: low cervical, transverse Postpartum Procedures: none Complications-Operative and Postpartum: none Hemoglobin  Date Value Ref Range Status  04/27/2016 8.5 (L) 12.0 - 16.0 g/dL Final   HCT  Date Value Ref Range Status  04/27/2016 25.3 (L) 35.0 - 47.0 % Final    Physical Exam:  General: alert, appears stated age and no distress Lochia: appropriate Uterine Fundus: firm Incision: healing well DVT Evaluation: No evidence of DVT seen on physical exam.  Discharge Diagnoses: Term Pregnancy-delivered  Discharge Information: Date: 04/29/2016 Activity: pelvic rest and no lifting greater than 10lbs for 6 weeks, no driving for 2 weeks, no intercourse for 6 weeks Diet: routine   Medication List    STOP taking these medications   acetaminophen 500 MG tablet Commonly known as:  TYLENOL     TAKE these medications   ferrous sulfate 325 (65 FE) MG tablet Commonly known as:  FERROUSUL Take 1 tablet (325 mg total) by mouth daily with breakfast.   ibuprofen 600 MG tablet Commonly known as:  ADVIL,MOTRIN Take 1 tablet (600 mg total) by mouth every 6 (six) hours.   multivitamin-prenatal 27-0.8 MG Tabs tablet Take 1 tablet by mouth daily at 12 noon.   oxyCODONE-acetaminophen 5-325 MG tablet Commonly known as:  PERCOCET/ROXICET Take 1 tablet by mouth every 4 (four) hours as needed (pain scale 4-7).      Condition: stable Discharge to: home Follow-up Information    Lorrene ReidSTAEBLER, Laverne Klugh M, MD. Schedule an appointment as soon as possible for a visit in 1 week(s).   Specialty:  Obstetrics and Gynecology Why:  incision check Contact information: [address] [place] [address] [phone]           Newborn Data: Live born female  Birth Weight: 7 lb 8.6 oz (3420 g) APGAR: 9, 10  Home with mother.  Janthony Holleman M 04/29/2016, 11:58  AM

## 2016-07-30 HISTORY — PX: WISDOM TOOTH EXTRACTION: SHX21

## 2016-11-13 ENCOUNTER — Encounter: Payer: Self-pay | Admitting: Obstetrics and Gynecology

## 2016-11-13 ENCOUNTER — Ambulatory Visit (INDEPENDENT_AMBULATORY_CARE_PROVIDER_SITE_OTHER): Payer: Medicaid Other | Admitting: Obstetrics and Gynecology

## 2016-11-13 VITALS — BP 120/80 | HR 77 | Ht 68.0 in | Wt 186.0 lb

## 2016-11-13 DIAGNOSIS — Z30011 Encounter for initial prescription of contraceptive pills: Secondary | ICD-10-CM

## 2016-11-13 DIAGNOSIS — Z3049 Encounter for surveillance of other contraceptives: Secondary | ICD-10-CM | POA: Diagnosis not present

## 2016-11-13 DIAGNOSIS — Z3046 Encounter for surveillance of implantable subdermal contraceptive: Secondary | ICD-10-CM

## 2016-11-13 MED ORDER — NORETHIN ACE-ETH ESTRAD-FE 1.5-30 MG-MCG PO TABS: 1.0000 | ORAL_TABLET | Freq: Every day | ORAL | 6 refills | Status: DC

## 2016-11-13 NOTE — Progress Notes (Signed)
   Chief Complaint  Patient presents with  . nexplanon removal     History of Present Illness:  Jessica Davidson is a 26 y.o. that had a nexplanon placed approximately 6 months ago at [facility] visit. Since that time, she states she had had constant, irreg bleeding and cramping, and she is tired of it. She would like to try OCPs. No hx of migraines/HTN/clotting disorders. She is not sex active. Not breastfeeding.   Nexplanon removal Procedure note - The Nexplanon was noted in the patient's arm and the end was identified. The skin was cleansed with a Betadine solution. A small injection of subcutaneous lidocaine with epinephrine was given over the end of the implant. An incision was made at the end of the implant. The rod was noted in the incision and grasped with a hemostat. It was noted to be intact.  Steri-Strip was placed approximating the incision. Hemostasis was noted.  Assessment:  Nexplanon removal  Encounter for initial prescription of contraceptive pills - OCP start today. Rx eRxd. Condoms for 1 mo. - Plan: norethindrone-ethinyl estradiol-iron (JUNEL FE 1.5/30) 1.5-30 MG-MCG tablet   Plan:   She was told to remove the dressing in 12-24 hours, to keep the incision area dry for 24 hours and to remove the Steristrip in 2-3  days.  Notify us if any signs of tenderness, redness, pain, or fevers develop.  F/u prn/ 11/18 annual.   Ivan Lacher B. Luigi Stuckey, PA-C 11/13/2016 11:25 AM

## 2016-12-01 ENCOUNTER — Emergency Department
Admission: EM | Admit: 2016-12-01 | Discharge: 2016-12-01 | Disposition: A | Discharge status: Home / Self Care | Payer: Medicaid Other | Attending: Emergency Medicine | Admitting: Emergency Medicine

## 2016-12-01 ENCOUNTER — Encounter: Payer: Self-pay | Admitting: Emergency Medicine

## 2016-12-01 DIAGNOSIS — F17200 Nicotine dependence, unspecified, uncomplicated: Secondary | ICD-10-CM | POA: Diagnosis not present

## 2016-12-01 DIAGNOSIS — K0381 Cracked tooth: Secondary | ICD-10-CM | POA: Insufficient documentation

## 2016-12-01 DIAGNOSIS — J0100 Acute maxillary sinusitis, unspecified: Secondary | ICD-10-CM

## 2016-12-01 DIAGNOSIS — S025XXA Fracture of tooth (traumatic), initial encounter for closed fracture: Secondary | ICD-10-CM

## 2016-12-01 DIAGNOSIS — R51 Headache: Secondary | ICD-10-CM | POA: Diagnosis present

## 2016-12-01 MED ORDER — SULFAMETHOXAZOLE-TRIMETHOPRIM 800-160 MG PO TABS: 1.0000 | ORAL_TABLET | Freq: Two times a day (BID) | ORAL | 0 refills | Status: DC

## 2016-12-01 MED ORDER — TRAMADOL HCL 50 MG PO TABS: 50.0000 mg | ORAL_TABLET | Freq: Four times a day (QID) | ORAL | 0 refills | Status: DC | PRN

## 2016-12-01 MED ORDER — FEXOFENADINE-PSEUDOEPHED ER 60-120 MG PO TB12: 1.0000 | ORAL_TABLET | Freq: Two times a day (BID) | ORAL | 0 refills | Status: DC

## 2016-12-01 NOTE — ED Triage Notes (Addendum)
Pt c/o chipping wisdom tooth on the right side about a week ago, states she is having pain on the right side of her face extending back through her right ear and neck. C/o pain with chewing and sensitivity to liquids. Denies any nausea, vomiting or photosensitivity. Pt states she took excedrin migraine and did not help with pain.

## 2016-12-01 NOTE — ED Notes (Signed)
NAD noted at time of D/C. Pt denies questions or concerns. Pt ambulatory to the lobby at this time.  

## 2016-12-01 NOTE — ED Notes (Signed)
Pt states chipped a "wisdom tooth" on Monday, states has a R sided headache and pain in her neck on the R side. Pt states has had headaches before but was able to control with OTC, pt states originally thought pain was sinus related but has been taking OTC sinus medication without relief. Pt is alert and oriented and neurologically intact. Pt however, c/o pain and stiffness when attempting to to tuck her chin to her chest. Pt also c/o generalized neck stiffness x "a few days".

## 2016-12-01 NOTE — ED Provider Notes (Signed)
[facility] Emergency Department Provider Note   ____________________________________________   First MD Initiated Contact with Patient 12/01/16 1149     (approximate)  I have reviewed the triage vital signs and the nursing notes.   HISTORY  Chief Complaint Dental Pain    HPI Jessica Davidson is a 26 y.o. female patient complain of right-sided facial pain. Patient states the pain starts right lateral aspect of her face and radiates to her ear. Patient states pain with chewing and sensitivity liquid. Patient states chipped fracture to the right upper wisdom tooth about a week ago. Patient also complaining of nasal congestion and postnasal drainage. Patient said over-the-counter medicine is not helping with her complaint.Patient rates the pain as 8/10. Patient described pain as "achy/pressure". Patient also states she developed total sensitivities today. Patient denies any nausea or vision disturbance this complaint. Patient denies vertigo. Denies history of migraine headaches.   Past Medical History:  Diagnosis Date  . Medical history non-contributory     Patient Active Problem List   Diagnosis Date Noted  . Labor and delivery, indication for care 04/24/2016  . Irregular contractions 04/08/2016    Past Surgical History:  Procedure Laterality Date  . CESAREAN SECTION N/A 04/26/2016   Procedure: CESAREAN SECTION;  Surgeon: Vena Austria, MD;  Location: [facility] ORS;  Service: Obstetrics;  Laterality: N/A;  . TONSILLECTOMY      Prior to Admission medications   Medication Sig Start Date End Date Taking? Authorizing Provider  ferrous sulfate (FERROUSUL) 325 (65 FE) MG tablet Take 1 tablet (325 mg total) by mouth daily with breakfast. 04/29/16   Vena Austria, MD  fexofenadine-pseudoephedrine (ALLEGRA-D) 60-120 MG 12 hr tablet Take 1 tablet by mouth 2 (two) times daily. 12/01/16   Joni Reining, PA-C  ibuprofen (ADVIL,MOTRIN) 600 MG tablet Take 1 tablet  (600 mg total) by mouth every 6 (six) hours. Patient not taking: Reported on 11/13/2016 04/29/16   Vena Austria, MD  norethindrone-ethinyl estradiol-iron (JUNEL FE 1.5/30) 1.5-30 MG-MCG tablet Take 1 tablet by mouth daily. 11/13/16   Copland, Ilona Sorrel, PA-C  oxyCODONE-acetaminophen (PERCOCET/ROXICET) 5-325 MG tablet Take 1 tablet by mouth every 4 (four) hours as needed (pain scale 4-7). Patient not taking: Reported on 11/13/2016 04/29/16   Vena Austria, MD  Prenatal Vit-Fe Fumarate-FA (MULTIVITAMIN-PRENATAL) 27-0.8 MG TABS tablet Take 1 tablet by mouth daily at 12 noon.    [provider]  sulfamethoxazole-trimethoprim (BACTRIM DS,SEPTRA DS) 800-160 MG tablet Take 1 tablet by mouth 2 (two) times daily. 12/01/16   Joni Reining, PA-C  traMADol (ULTRAM) 50 MG tablet Take 1 tablet (50 mg total) by mouth every 6 (six) hours as needed for moderate pain. 12/01/16   Joni Reining, PA-C    Allergies Amoxicillin  Family History  Problem Relation Age of Onset  . Diabetes Mother   . Hypertension Mother   . Hypertension Father     Social History Social History  Substance Use Topics  . Smoking status: Current Every Day Smoker    Packs/day: 0.25  . Smokeless tobacco: Former Neurosurgeon  . Alcohol use No    Review of Systems  Constitutional: No fever/chills Eyes: No visual changes. ENT: No sore throat.Dental or facial pain. Cardiovascular: Denies chest pain. Respiratory: Denies shortness of breath. Gastrointestinal: No abdominal pain.  No nausea, no vomiting.  No diarrhea.  No constipation. Genitourinary: Negative for dysuria. Musculoskeletal: Negative for back pain. Skin: Negative for rash. Neurological: Negative for headaches, focal weakness or numbness. Allergic/Immunilogical: Amoxil  ____________________________________________   PHYSICAL EXAM:  VITAL SIGNS: ED Triage Vitals  Enc Vitals Group     BP 12/01/16 1141 108/68     Pulse Rate 12/01/16 1141 67     Resp  12/01/16 1141 18     Temp 12/01/16 1141 98.4 F (36.9 C)     Temp Source 12/01/16 1141 Oral     SpO2 12/01/16 1141 100 %     Weight 12/01/16 1142 232 lb (105.2 kg)     Height 12/01/16 1142 5\' 7"  (1.702 m)     Head Circumference --      Peak Flow --      Pain Score 12/01/16 1141 8     Pain Loc --      Pain Edu? --      Excl. in [place]? --     Constitutional: Alert and oriented. Well appearing and in no acute distress. Eyes: Conjunctivae are normal. PERRL. EOMI. Head: Atraumatic. Nose: Edematous nasal turbinates thick rhinorrhea . Right maxillary and frontal guarding with palpation . Edematous right TM . Mouth/Throat: Mucous membranes are moist.  Oropharynx non-erythematous. Chipped fractured tooth #16 Neck: No stridor.  No cervical spine tenderness to palpation. Hematological/Lymphatic/Immunilogical: No cervical lymphadenopathy. Cardiovascular: Normal rate, regular rhythm. Grossly normal heart sounds.  Good peripheral circulation. Respiratory: Normal respiratory effort.  No retractions. Lungs CTAB. Gastrointestinal: Soft and nontender. No distention. No abdominal bruits. No CVA tenderness. Musculoskeletal: No lower extremity tenderness nor edema.  No joint effusions. Neurologic:  Normal speech and language. No gross focal neurologic deficits are appreciated. No gait instability. Skin:  Skin is warm, dry and intact. No rash noted. Psychiatric: Mood and affect are normal. Speech and behavior are normal.  ____________________________________________   LABS (all labs ordered are listed, but only abnormal results are displayed)  Labs Reviewed - No data to display ____________________________________________  EKG   ____________________________________________  RADIOLOGY   ____________________________________________   PROCEDURES  Procedure(s) performed: None  Procedures  Critical Care performed: No  ____________________________________________   INITIAL IMPRESSION /  ASSESSMENT AND PLAN / ED COURSE  Pertinent labs & imaging results that were available during my care of the patient were reviewed by me and considered in my medical decision making (see chart for details).  Acute maxillary sinusitis. Chipped fracture tooth #16. Patient given discharge care instructions. Patient advised to follow-up with PCP for sinusitis and given a list of dental clinic for follow-up care.      ____________________________________________   FINAL CLINICAL IMPRESSION(S) / ED DIAGNOSES  Final diagnoses:  Acute non-recurrent maxillary sinusitis  Closed fracture of tooth, initial encounter      NEW MEDICATIONS STARTED DURING THIS VISIT:  New Prescriptions   FEXOFENADINE-PSEUDOEPHEDRINE (ALLEGRA-D) 60-120 MG 12 HR TABLET    Take 1 tablet by mouth 2 (two) times daily.   SULFAMETHOXAZOLE-TRIMETHOPRIM (BACTRIM DS,SEPTRA DS) 800-160 MG TABLET    Take 1 tablet by mouth 2 (two) times daily.   TRAMADOL (ULTRAM) 50 MG TABLET    Take 1 tablet (50 mg total) by mouth every 6 (six) hours as needed for moderate pain.     Note:  This document was prepared using Dragon voice recognition software and may include unintentional dictation errors.    Joni ReiningSmith, Ronald K, PA-C 12/01/16 1209    Governor RooksLord, Rebecca, MD 12/01/16 [phone]

## 2016-12-01 NOTE — ED Notes (Addendum)
FIRST NURSE NOTE: [facility] c/o headache not relieved with excedrin, states she chipped a wisdom tooth on the same side of where her head hurts. c/o sensitivity to sounds not light.

## 2019-02-05 ENCOUNTER — Other Ambulatory Visit: Payer: Self-pay

## 2019-02-05 ENCOUNTER — Emergency Department
Admission: EM | Admit: 2019-02-05 | Discharge: 2019-02-05 | Disposition: A | Discharge status: Home / Self Care | Payer: Medicaid Other | Attending: Emergency Medicine | Admitting: Emergency Medicine

## 2019-02-05 ENCOUNTER — Encounter: Payer: Self-pay | Admitting: Emergency Medicine

## 2019-02-05 ENCOUNTER — Emergency Department: Payer: Medicaid Other

## 2019-02-05 DIAGNOSIS — R319 Hematuria, unspecified: Secondary | ICD-10-CM | POA: Insufficient documentation

## 2019-02-05 DIAGNOSIS — F17210 Nicotine dependence, cigarettes, uncomplicated: Secondary | ICD-10-CM | POA: Diagnosis not present

## 2019-02-05 DIAGNOSIS — R109 Unspecified abdominal pain: Secondary | ICD-10-CM

## 2019-02-05 DIAGNOSIS — R1031 Right lower quadrant pain: Secondary | ICD-10-CM | POA: Insufficient documentation

## 2019-02-05 DIAGNOSIS — M5489 Other dorsalgia: Secondary | ICD-10-CM | POA: Diagnosis present

## 2019-02-05 LAB — COMPREHENSIVE METABOLIC PANEL
ALT: 13 U/L (ref 0–44)
AST: 16 U/L (ref 15–41)
Albumin: 4.2 g/dL (ref 3.5–5.0)
Alkaline Phosphatase: 69 U/L (ref 38–126)
Anion gap: 10 (ref 5–15)
BUN: 12 mg/dL (ref 6–20)
CO2: 23 mmol/L (ref 22–32)
Calcium: 9.7 mg/dL (ref 8.9–10.3)
Chloride: 110 mmol/L (ref 98–111)
Creatinine, Ser: 0.66 mg/dL (ref 0.44–1.00)
GFR calc Af Amer: >60 mL/min (ref >60)
GFR calc non Af Amer: >60 mL/min (ref >60)
Glucose, Bld: 106 mg/dL — ABNORMAL HIGH (ref 70–99)
Potassium: 3.7 mmol/L (ref 3.5–5.1)
Sodium: 143 mmol/L (ref 135–145)
Total Bilirubin: 0.4 mg/dL (ref 0.3–1.2)
Total Protein: 7.8 g/dL (ref 6.5–8.1)

## 2019-02-05 LAB — URINALYSIS, COMPLETE (UACMP) WITH MICROSCOPIC
Bacteria, UA: NONE SEEN
Bilirubin Urine: NEGATIVE
Glucose, UA: NEGATIVE mg/dL
Ketones, ur: NEGATIVE mg/dL
Leukocytes,Ua: NEGATIVE
Nitrite: NEGATIVE
Protein, ur: NEGATIVE mg/dL
RBC / HPF: >50 RBC/hpf — ABNORMAL HIGH (ref 0–5)
Specific Gravity, Urine: 1.016 (ref 1.005–1.030)
pH: 7 (ref 5.0–8.0)

## 2019-02-05 LAB — CBC
HCT: 38.8 % (ref 36.0–46.0)
Hemoglobin: 13 g/dL (ref 12.0–15.0)
MCH: 26.8 pg (ref 26.0–34.0)
MCHC: 33.5 g/dL (ref 30.0–36.0)
MCV: 80 fL (ref 80.0–100.0)
Platelets: 268 10*3/uL (ref 150–400)
RBC: 4.85 MIL/uL (ref 3.87–5.11)
RDW: 15.7 % — ABNORMAL HIGH (ref 11.5–15.5)
WBC: 6.1 10*3/uL (ref 4.0–10.5)
nRBC: 0 % (ref 0.0–0.2)

## 2019-02-05 LAB — LIPASE, BLOOD: Lipase: 46 U/L (ref 11–51)

## 2019-02-05 LAB — POCT PREGNANCY, URINE: Preg Test, Ur: NEGATIVE

## 2019-02-05 MED ORDER — IBUPROFEN 800 MG PO TABS: 800.0000 mg | ORAL_TABLET | Freq: Three times a day (TID) | ORAL | 0 refills | Status: DC | PRN

## 2019-02-05 MED ORDER — SODIUM CHLORIDE 0.9% FLUSH: 3.0000 mL | Freq: Once | INTRAVENOUS | Status: DC

## 2019-02-05 NOTE — ED Provider Notes (Addendum)
[facility] Emergency Department Provider Note       Time seen: ----------------------------------------- 9:26 AM on 02/05/2019 -----------------------------------------   I have reviewed the triage vital signs and the nursing notes.  HISTORY   Chief Complaint Back Pain and Flank Pain    HPI Jessica Davidson is a 28 y.o. female with no significant past medical history who presents to the ED for sudden onset of right-sided abdominal pain while she was at work.  Patient states she tried to drink some water and walk it off but it was not getting any better.  Pain was 7 out of 10 and she states she had to curl into a ball to help the pain.  She has never had this before.  Past Medical History:  Diagnosis Date  . Medical history non-contributory     Patient Active Problem List   Diagnosis Date Noted  . Labor and delivery, indication for care 04/24/2016  . Irregular contractions 04/08/2016    Past Surgical History:  Procedure Laterality Date  . CESAREAN SECTION N/A 04/26/2016   Procedure: CESAREAN SECTION;  Surgeon: Malachy Mood, MD;  Location: [facility] ORS;  Service: Obstetrics;  Laterality: N/A;  . TONSILLECTOMY      Allergies Amoxicillin  Social History Social History   Tobacco Use  . Smoking status: Current Every Day Smoker    Packs/day: 0.25  . Smokeless tobacco: Former Network engineer Use Topics  . Alcohol use: No  . Drug use: No   Review of Systems Constitutional: Negative for fever. Cardiovascular: Negative for chest pain. Respiratory: Negative for shortness of breath. Gastrointestinal: Positive for abdominal pain Musculoskeletal: Negative for back pain. Skin: Negative for rash. Neurological: Negative for headaches, focal weakness or numbness.  All systems negative/normal/unremarkable except as stated in the HPI  ____________________________________________   PHYSICAL EXAM:  VITAL SIGNS: ED Triage Vitals  Enc Vitals Group    BP 02/05/19 0856 109/72     Pulse Rate 02/05/19 0856 (!) 58     Resp --      Temp 02/05/19 0856 97.6 F (36.4 C)     Temp Source 02/05/19 0856 Oral     SpO2 02/05/19 0856 100 %     Weight 02/05/19 0850 250 lb (113.4 kg)     Height 02/05/19 0850 5\' 8"  (1.727 m)     Head Circumference --      Peak Flow --      Pain Score 02/05/19 0850 7     Pain Loc --      Pain Edu? --      Excl. in [place]? --    Constitutional: Alert and oriented. Well appearing and in no distress. Eyes: Conjunctivae are normal. Normal extraocular movements. ENT      Head: Normocephalic and atraumatic.      Nose: No congestion/rhinnorhea.      Mouth/Throat: Mucous membranes are moist.      Neck: No stridor. Cardiovascular: Normal rate, regular rhythm. No murmurs, rubs, or gallops. Respiratory: Normal respiratory effort without tachypnea nor retractions. Breath sounds are clear and equal bilaterally. No wheezes/rales/rhonchi. Gastrointestinal: Mild right flank tenderness, no rebound or guarding.  Normal bowel sounds. Musculoskeletal: Nontender with normal range of motion in extremities. No lower extremity tenderness nor edema. Neurologic:  Normal speech and language. No gross focal neurologic deficits are appreciated.  Skin:  Skin is warm, dry and intact. No rash noted. Psychiatric: Mood and affect are normal. Speech and behavior are normal.  ____________________________________________  ED COURSE:  As part of my medical decision making, I reviewed the following data within the electronic MEDICAL RECORD NUMBER History obtained from family if available, nursing notes, old chart and ekg, as well as notes from prior ED visits. Patient presented for abdominal pain, we will assess with labs and imaging as indicated at this time.   Procedures  Angelena SoleJakiah Doffing was evaluated in Emergency Department on 02/05/2019 for the symptoms described in the history of present illness. She was evaluated in the context of the global COVID-19  pandemic, which necessitated consideration that the patient might be at risk for infection with the SARS-CoV-2 virus that causes COVID-19. Institutional protocols and algorithms that pertain to the evaluation of patients at risk for COVID-19 are in a state of rapid change based on information released by regulatory bodies including the CDC and federal and state organizations. These policies and algorithms were followed during the patient's care in the ED.  ____________________________________________   LABS (pertinent positives/negatives)  Labs Reviewed  COMPREHENSIVE METABOLIC PANEL - Abnormal; Notable for the following components:      Result Value   Glucose, Bld 106 (*)    All other components within normal limits  CBC - Abnormal; Notable for the following components:   RDW 15.7 (*)    All other components within normal limits  URINALYSIS, COMPLETE (UACMP) WITH MICROSCOPIC - Abnormal; Notable for the following components:   Color, Urine YELLOW (*)    APPearance HAZY (*)    Hgb urine dipstick LARGE (*)    RBC / HPF >50 (*)    All other components within normal limits  LIPASE, BLOOD  POC URINE PREG, ED  POCT PREGNANCY, URINE    RADIOLOGY  CT renal protocol Is unremarkable ____________________________________________   DIFFERENTIAL DIAGNOSIS   Renal colic, UTI, pyelonephritis, gas pain, muscle strain, pregnancy  FINAL ASSESSMENT AND PLAN  Flank pain, hematuria   Plan: The patient had presented for right flank pain. Patient's labs were unremarkable with the exception of hematuria. Patient's imaging did not reveal any acute process.  Possible recently passed kidney stone.  She is medically cleared for outpatient follow-up with anti-inflammatory medication.   Ulice DashJohnathan E , MD    Note: This note was generated in part or whole with voice recognition software. Voice recognition is usually quite accurate but there are transcription errors that can and very often do  occur. I apologize for any typographical errors that were not detected and corrected.     Emily Filbert,  E, MD 02/05/19 [id number]    Emily Filbert,  E, MD 02/05/19 1102

## 2019-02-05 NOTE — ED Notes (Signed)
Patient transported to CT 

## 2019-02-05 NOTE — ED Triage Notes (Signed)
Sudden onset pain in right side (cramp) at work.  She tried to drink some waater and walk it off.  No getting better

## 2019-06-30 ENCOUNTER — Other Ambulatory Visit: Payer: Self-pay

## 2019-06-30 DIAGNOSIS — Z20822 Contact with and (suspected) exposure to covid-19: Secondary | ICD-10-CM

## 2019-07-02 LAB — NOVEL CORONAVIRUS, NAA: SARS-CoV-2, NAA: NOT DETECTED

## 2020-01-13 ENCOUNTER — Encounter: Payer: Self-pay | Admitting: Family Medicine

## 2020-01-13 ENCOUNTER — Ambulatory Visit (LOCAL_COMMUNITY_HEALTH_CENTER): Payer: Medicaid Other | Admitting: Family Medicine

## 2020-01-13 ENCOUNTER — Other Ambulatory Visit: Payer: Self-pay

## 2020-01-13 VITALS — BP 120/79 | Ht 67.0 in | Wt 232.0 lb

## 2020-01-13 DIAGNOSIS — Z3009 Encounter for other general counseling and advice on contraception: Secondary | ICD-10-CM

## 2020-01-13 DIAGNOSIS — Z30011 Encounter for initial prescription of contraceptive pills: Secondary | ICD-10-CM

## 2020-01-13 MED ORDER — NORGESTREL-ETHINYL ESTRADIOL 0.3-30 MG-MCG PO TABS: ORAL_TABLET | ORAL | 3 refills | Status: DC

## 2020-01-13 MED ORDER — THERA VITAL M PO TABS: 1.0000 | ORAL_TABLET | Freq: Every day | ORAL | 0 refills | Status: DC

## 2020-01-13 NOTE — Progress Notes (Signed)
Pt here for physical, STD screening and start birth control pills.

## 2020-01-13 NOTE — Progress Notes (Signed)
Family Planning Visit- Initial Visit  Subjective:  Jessica Davidson is a 29 y.o.  G1P1   being seen today for an initial well woman visit and to discuss family planning options.  She is currently using condoms for pregnancy prevention. Patient reports she does not want a pregnancy in the next year.  Patient has the following medical conditions has Irregular contractions and Labor and delivery, indication for care on their problem list.  Chief Complaint  Patient presents with  . Contraception    Physical and OCP's  . SEXUALLY TRANSMITTED DISEASE    screening    Patient reports she is here for annual exam.  She would like to start on birth control pills similar to the ones she took a couple years ago.  Client stats she has gained weight since birth of her child 3 years ago.  She is trying to loose weight with better food choices, etc.  Patient denies concerns  Body mass index is 36.34 kg/m. - Patient is eligible for diabetes screening based on BMI and age >32?  no HA1C ordered? no  Patient reports 1 of partners in last year. Desires STI screening?  Yes  Has patient been screened once for HCV in the past?  No  No results found for: HCVAB  Does the patient have current of drug use, have a partner with drug use, and/or has been incarcerated since last result? No  If yes-- Screen for HCV through [facility]   Does the patient meet criteria for HBV testing? No  Criteria:  -Household, sexual or needle sharing contact with HBV -History of drug use -HIV positive -Those with known Hep C   Health Maintenance Due  Topic Date Due  . Hepatitis C Screening  Never done  . COVID-19 Vaccine (1) Never done  . TETANUS/TDAP  Never done  . PAP-Cervical Cytology Screening  Never done  . PAP SMEAR-Modifier  Never done    Review of Systems  All other systems reviewed and are negative.   The following portions of the patient's history were reviewed and updated as appropriate: allergies,  current medications, past family history, past medical history, past social history, past surgical history and problem list. Problem list updated.   See flowsheet for other program required questions.  Objective:   Vitals:   01/13/20 1335  BP: 120/79  Weight: 232 lb (105.2 kg)  Height: 5\' 7"  (1.702 m)    Physical Exam Constitutional:      Appearance: Normal appearance.  HENT:     Head: Normocephalic.  Cardiovascular:     Rate and Rhythm: Normal rate.  Pulmonary:     Effort: Pulmonary effort is normal.  Abdominal:     Palpations: Abdomen is soft.  Genitourinary:    General: Normal vulva.     Vagina: No vaginal discharge.     Rectum: Normal.  Musculoskeletal:     Cervical back: Neck supple.  Lymphadenopathy:     Cervical: No cervical adenopathy.  Skin:    General: Skin is warm and dry.  Neurological:     Mental Status: She is alert and oriented to person, place, and time.    Assessment and Plan:  Jessica Davidson is a 29 y.o. female presenting to the [facility] Department for an initial well woman exam/family planning visit  Contraception counseling: Reviewed all forms of birth control options in the tiered based approach. available including abstinence; over the counter/barrier methods; hormonal contraceptive medication including pill, patch, ring, injection,contraceptive  implant, ECP; hormonal and nonhormonal IUDs; permanent sterilization options including vasectomy and the various tubal sterilization modalities. Risks, benefits, and typical effectiveness rates were reviewed.  Questions were answered.  Written information was also given to the patient to review.  Patient desires Cryselle, this was prescribed for patient. She will follow up in  PRN or 1 year for surveillance.  She was told to call with any further questions, or with any concerns about this method of contraception.  Emphasized use of condoms 100% of the time for STI prevention.  Patient is not an ECP  candidate  ECPcounseling was not given -    1. Family planning  - IGP, rfx Aptima HPV ASCU - HIV [place] [facility] - Syphilis Serology, [place] [facility] - Chlamydia/Gonorrhea [place] [facility] - Multiple Vitamins-Minerals (MULTIVITAMIN) tablet; Take 1 tablet by mouth daily.  Dispense: 100 tablet; Refill: 0  2. OCP (oral contraceptive pills) initiation  - norgestrel-ethinyl estradiol (LO/OVRAL) 0.3-30 MG-MCG tablet; Dispense 4 packages of OCPs, client to take 12 weeks of active pills then 1 week of placebos on 13th week    repeat x 1 yr 3 RF  Dispense: 28 tablet; Refill: 3 RX Escript to [facility]. To use condoms x 1 week for back-up and always for STD prevention.  3. Obesity- BMI 36.34. Co on healthy food, exercise, etc.  Give healthy weight management information to client.    No follow-ups on file.  No future appointments.  Hassell Done, FNP

## 2020-01-14 NOTE — Progress Notes (Addendum)
Pt received MVI's per pt request. Pt aware that rx for birth control pills were sent to her pharmacy on file. Counseled pt per provider orders and pt states understanding. Provider orders completed.

## 2020-01-15 LAB — IGP, RFX APTIMA HPV ASCU: PAP Smear Comment: 0

## 2020-01-20 ENCOUNTER — Emergency Department: Payer: No Typology Code available for payment source

## 2020-01-20 ENCOUNTER — Other Ambulatory Visit: Payer: Self-pay

## 2020-01-20 ENCOUNTER — Emergency Department
Admission: EM | Admit: 2020-01-20 | Discharge: 2020-01-20 | Disposition: A | Discharge status: Home / Self Care | Payer: No Typology Code available for payment source | Attending: Emergency Medicine | Admitting: Emergency Medicine

## 2020-01-20 DIAGNOSIS — M25562 Pain in left knee: Secondary | ICD-10-CM | POA: Diagnosis not present

## 2020-01-20 DIAGNOSIS — F17210 Nicotine dependence, cigarettes, uncomplicated: Secondary | ICD-10-CM | POA: Diagnosis not present

## 2020-01-20 DIAGNOSIS — Z79899 Other long term (current) drug therapy: Secondary | ICD-10-CM | POA: Diagnosis not present

## 2020-01-20 MED ORDER — METHOCARBAMOL 500 MG PO TABS: 500.0000 mg | ORAL_TABLET | Freq: Three times a day (TID) | ORAL | 0 refills | Status: AC | PRN

## 2020-01-20 MED ORDER — MELOXICAM 15 MG PO TABS: 15.0000 mg | ORAL_TABLET | Freq: Every day | ORAL | 1 refills | Status: AC
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
## 2020-02-23 IMAGING — CT CT RENAL STONE PROTOCOL
2 of 4 series · 17 of 46 positions shown, 19 images · non-contrast
Comparison: None.

CLINICAL DATA: Acute right flank pain.

EXAM:
CT ABDOMEN AND PELVIS WITHOUT CONTRAST
TECHNIQUE: Multidetector CT imaging of the abdomen and pelvis was performed
following the standard protocol without IV contrast.

[Series 2: stone full standard · axial · 0.70mm/px · z∈[-508,-113]mm · 14 of 87 slices shown, 16 images]
[im 4/87  soft-tissue]
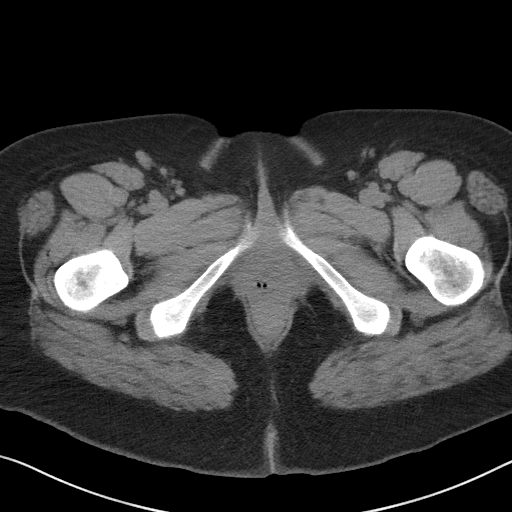
[im 4/87  bone]
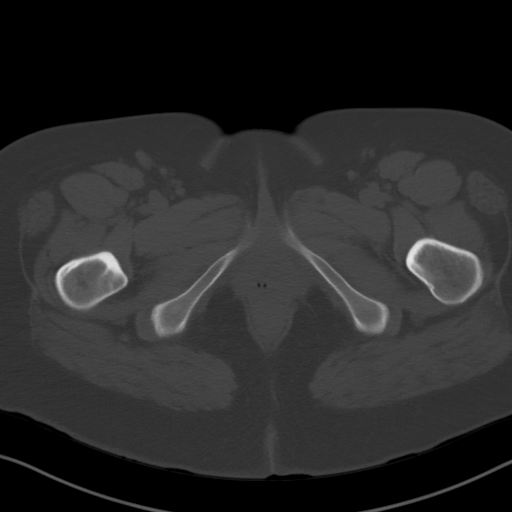
[im 11/87  soft-tissue]
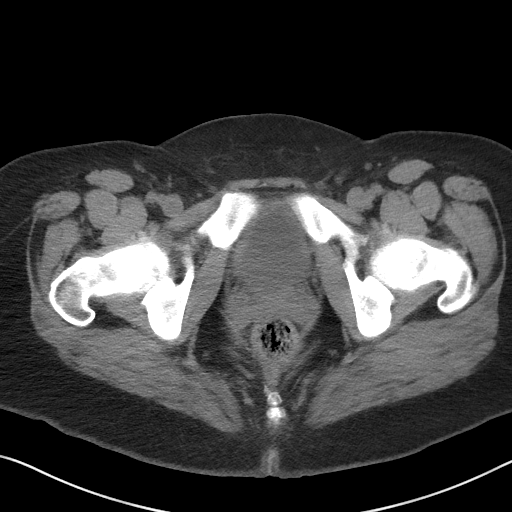
[im 18/87  soft-tissue]
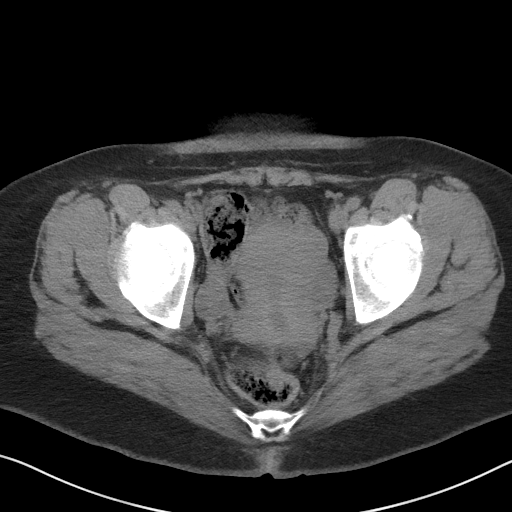
[im 22/87  soft-tissue]
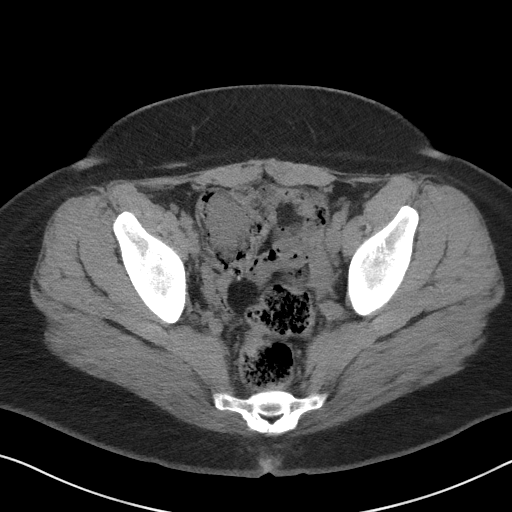
[im 29/87  soft-tissue]
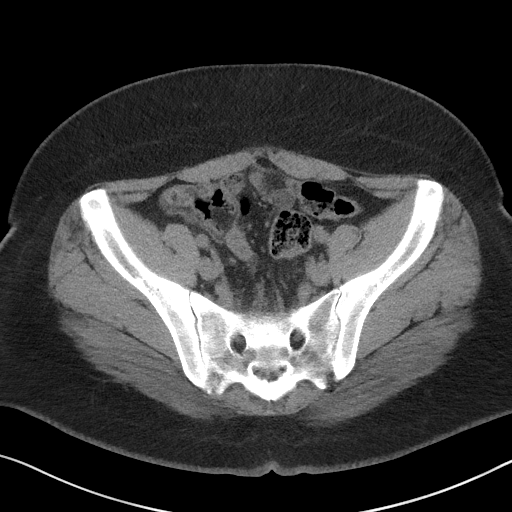
[im 36/87  soft-tissue]
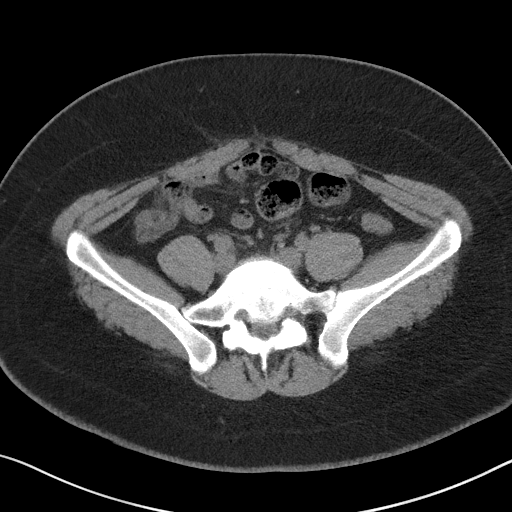
[im 40/87  soft-tissue]
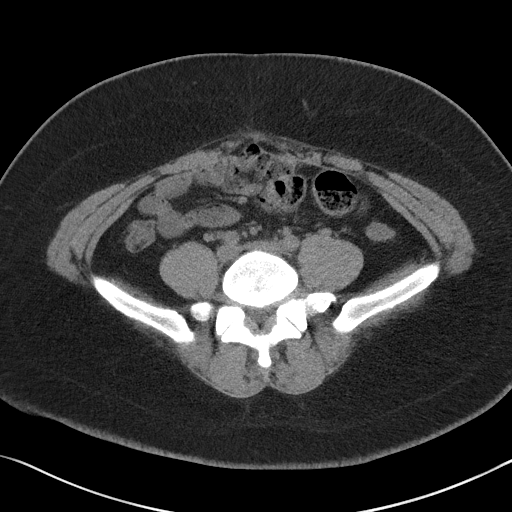
[im 47/87  soft-tissue]
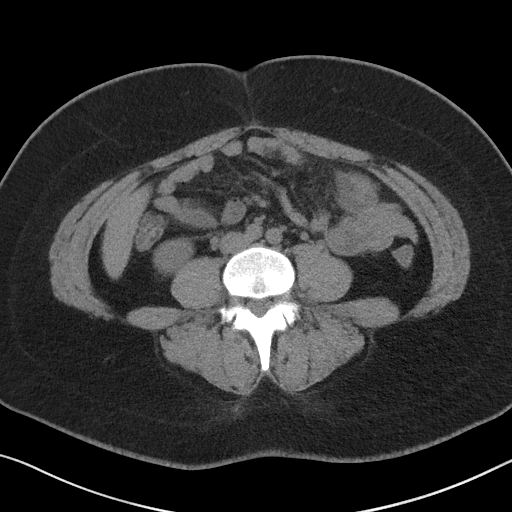
[im 51/87  soft-tissue]
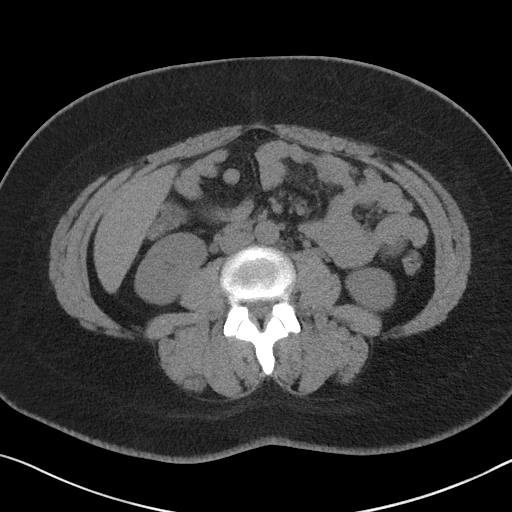
[im 51/87  bone]
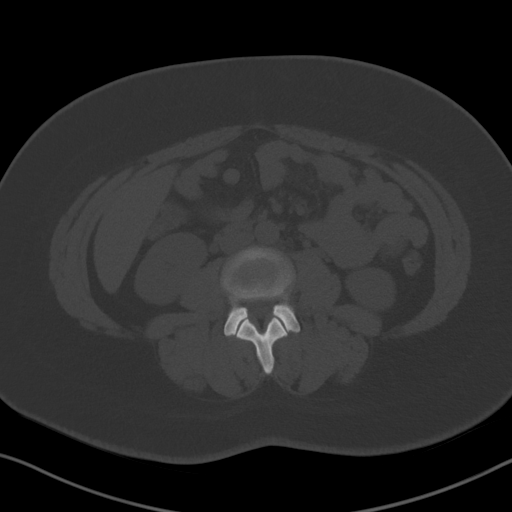
[im 58/87  soft-tissue]
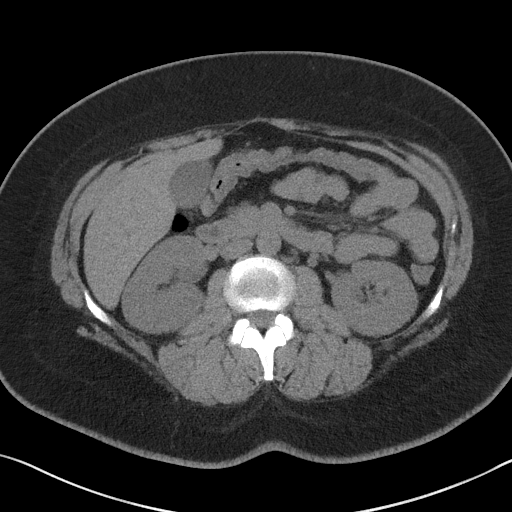
[im 65/87  soft-tissue]
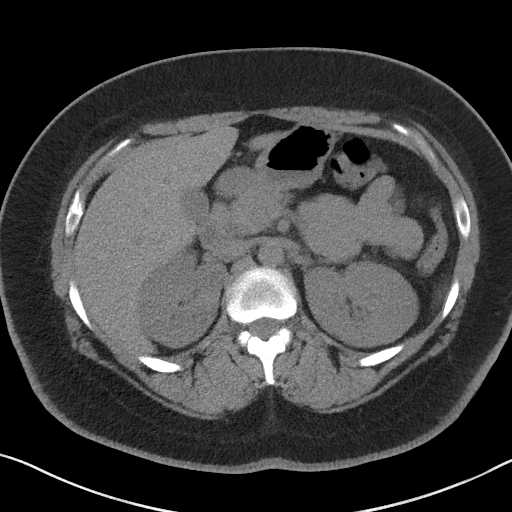
[im 69/87  soft-tissue]
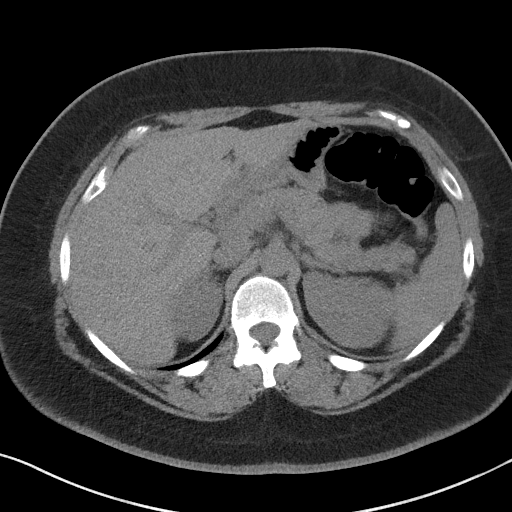
[im 76/87  soft-tissue]
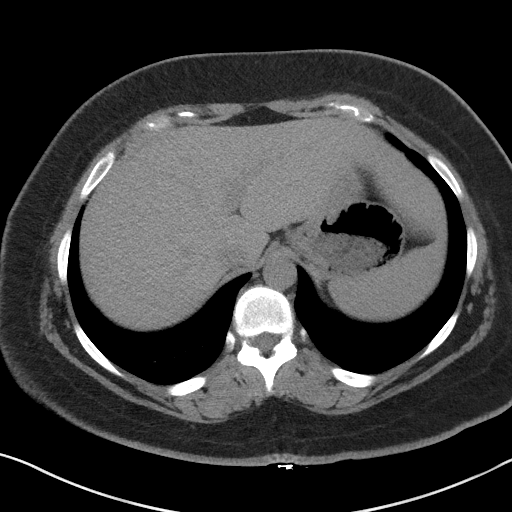
[im 83/87  soft-tissue]
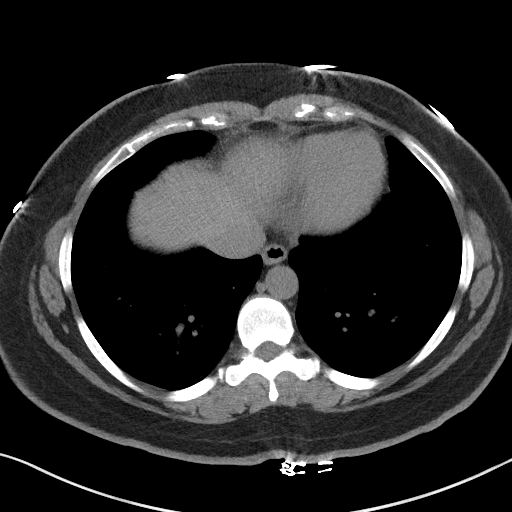

[Series 5: coronal · coronal · 0.80mm/px · 3 of 152 slices shown]
[im 51/152  soft-tissue]
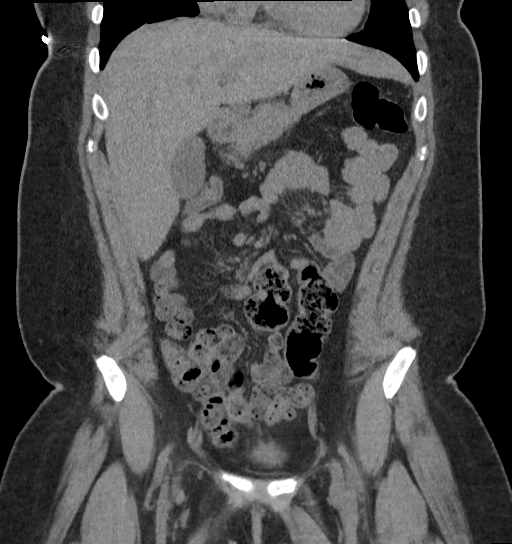
[im 68/152  soft-tissue]
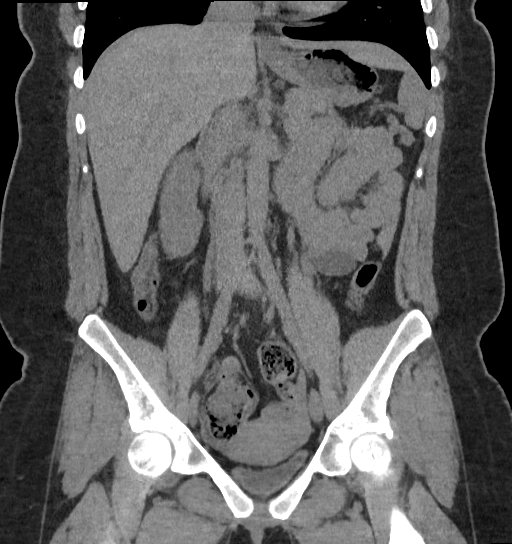
[im 84/152  soft-tissue]
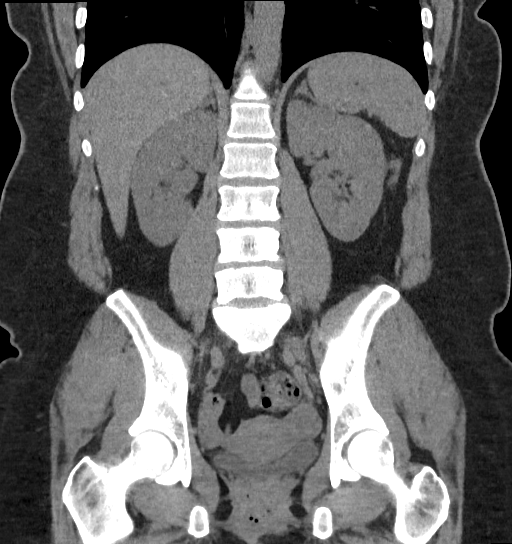

[17 of 46 positions shown; findings below may reference images not displayed]

FINDINGS: Lower chest: No acute abnormality.

Hepatobiliary: No focal liver abnormality is seen. No gallstones,
gallbladder wall thickening, or biliary dilatation.

Pancreas: Unremarkable. No pancreatic ductal dilatation or
surrounding inflammatory changes.

Spleen: Normal in size without focal abnormality.

Adrenals/Urinary Tract: Adrenal glands are unremarkable. Kidneys are
normal, without renal calculi, focal lesion, or hydronephrosis.
Bladder is unremarkable.

Stomach/Bowel: Stomach is within normal limits. Appendix appears
normal. No evidence of bowel wall thickening, distention, or
inflammatory changes.

Vascular/Lymphatic: No significant vascular findings are present. No
enlarged abdominal or pelvic lymph nodes.

Reproductive: Uterus and bilateral adnexa are unremarkable.

Other: No abdominal wall hernia or abnormality. No abdominopelvic
ascites.

Musculoskeletal: No acute or significant osseous findings.
IMPRESSION: No abnormality seen in the abdomen or pelvis.

## 2020-05-16 ENCOUNTER — Emergency Department: Payer: Medicaid Other

## 2020-05-16 ENCOUNTER — Encounter: Payer: Self-pay | Admitting: Emergency Medicine

## 2020-05-16 DIAGNOSIS — R0981 Nasal congestion: Secondary | ICD-10-CM | POA: Insufficient documentation

## 2020-05-16 DIAGNOSIS — Z20822 Contact with and (suspected) exposure to covid-19: Secondary | ICD-10-CM | POA: Insufficient documentation

## 2020-05-16 DIAGNOSIS — R6883 Chills (without fever): Secondary | ICD-10-CM | POA: Insufficient documentation

## 2020-05-16 DIAGNOSIS — Z5321 Procedure and treatment not carried out due to patient leaving prior to being seen by health care provider: Secondary | ICD-10-CM | POA: Insufficient documentation

## 2020-05-16 DIAGNOSIS — R059 Cough, unspecified: Secondary | ICD-10-CM | POA: Insufficient documentation

## 2020-05-16 NOTE — ED Triage Notes (Signed)
Pt c/o cough, congestion and chills x2 days. Pt denies fever or SOB.

## 2020-05-17 ENCOUNTER — Emergency Department
Admission: EM | Admit: 2020-05-17 | Discharge: 2020-05-17 | Disposition: A | Discharge status: Home / Self Care | Payer: Medicaid Other | Attending: Emergency Medicine | Admitting: Emergency Medicine

## 2020-05-17 LAB — RESPIRATORY PANEL BY RT PCR (FLU A&B, COVID)
Influenza A by PCR: NEGATIVE
Influenza B by PCR: NEGATIVE
SARS Coronavirus 2 by RT PCR: NEGATIVE

## 2020-06-12 ENCOUNTER — Emergency Department: Payer: Medicaid Other

## 2020-06-12 ENCOUNTER — Emergency Department
Admission: EM | Admit: 2020-06-12 | Discharge: 2020-06-12 | Disposition: A | Discharge status: Home / Self Care | Payer: Medicaid Other | Attending: Emergency Medicine | Admitting: Emergency Medicine

## 2020-06-12 ENCOUNTER — Other Ambulatory Visit: Payer: Self-pay

## 2020-06-12 DIAGNOSIS — M542 Cervicalgia: Secondary | ICD-10-CM | POA: Diagnosis not present

## 2020-06-12 DIAGNOSIS — M25552 Pain in left hip: Secondary | ICD-10-CM | POA: Diagnosis not present

## 2020-06-12 DIAGNOSIS — F17200 Nicotine dependence, unspecified, uncomplicated: Secondary | ICD-10-CM | POA: Diagnosis not present

## 2020-06-12 DIAGNOSIS — M546 Pain in thoracic spine: Secondary | ICD-10-CM | POA: Diagnosis not present

## 2020-06-12 LAB — POC URINE PREG, ED
Preg Test, Ur: NEGATIVE
Preg Test, Ur: NEGATIVE

## 2020-06-12 MED ORDER — METHOCARBAMOL 500 MG PO TABS: 500.0000 mg | ORAL_TABLET | Freq: Three times a day (TID) | ORAL | 0 refills | Status: AC | PRN

## 2020-06-12 MED ORDER — MELOXICAM 15 MG PO TABS: 15.0000 mg | ORAL_TABLET | Freq: Every day | ORAL | 2 refills | Status: AC
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
## 2020-06-12 NOTE — ED Notes (Signed)
No peripheral IV placed this visit.   Discharge instructions reviewed with patient. Questions fielded by this RN. Patient verbalizes understanding of instructions. Patient discharged home in stable condition per [place], [place] . No acute distress noted at time of discharge.   Declined DC VS

## 2020-06-12 NOTE — Discharge Instructions (Signed)
Take Meloxicam and Robaxin for pain and inflammation.

## 2021-06-03 IMAGING — CR DG CHEST 2V
1 series · 2 of 2 positions shown · non-contrast
Comparison: None.

CLINICAL DATA: Cough and congestion for 2 days

EXAM:
CHEST - 2 VIEW

[Series 1: w chest pa · 0.14mm/px · 2 of 2 slices shown]
[im 1/2]
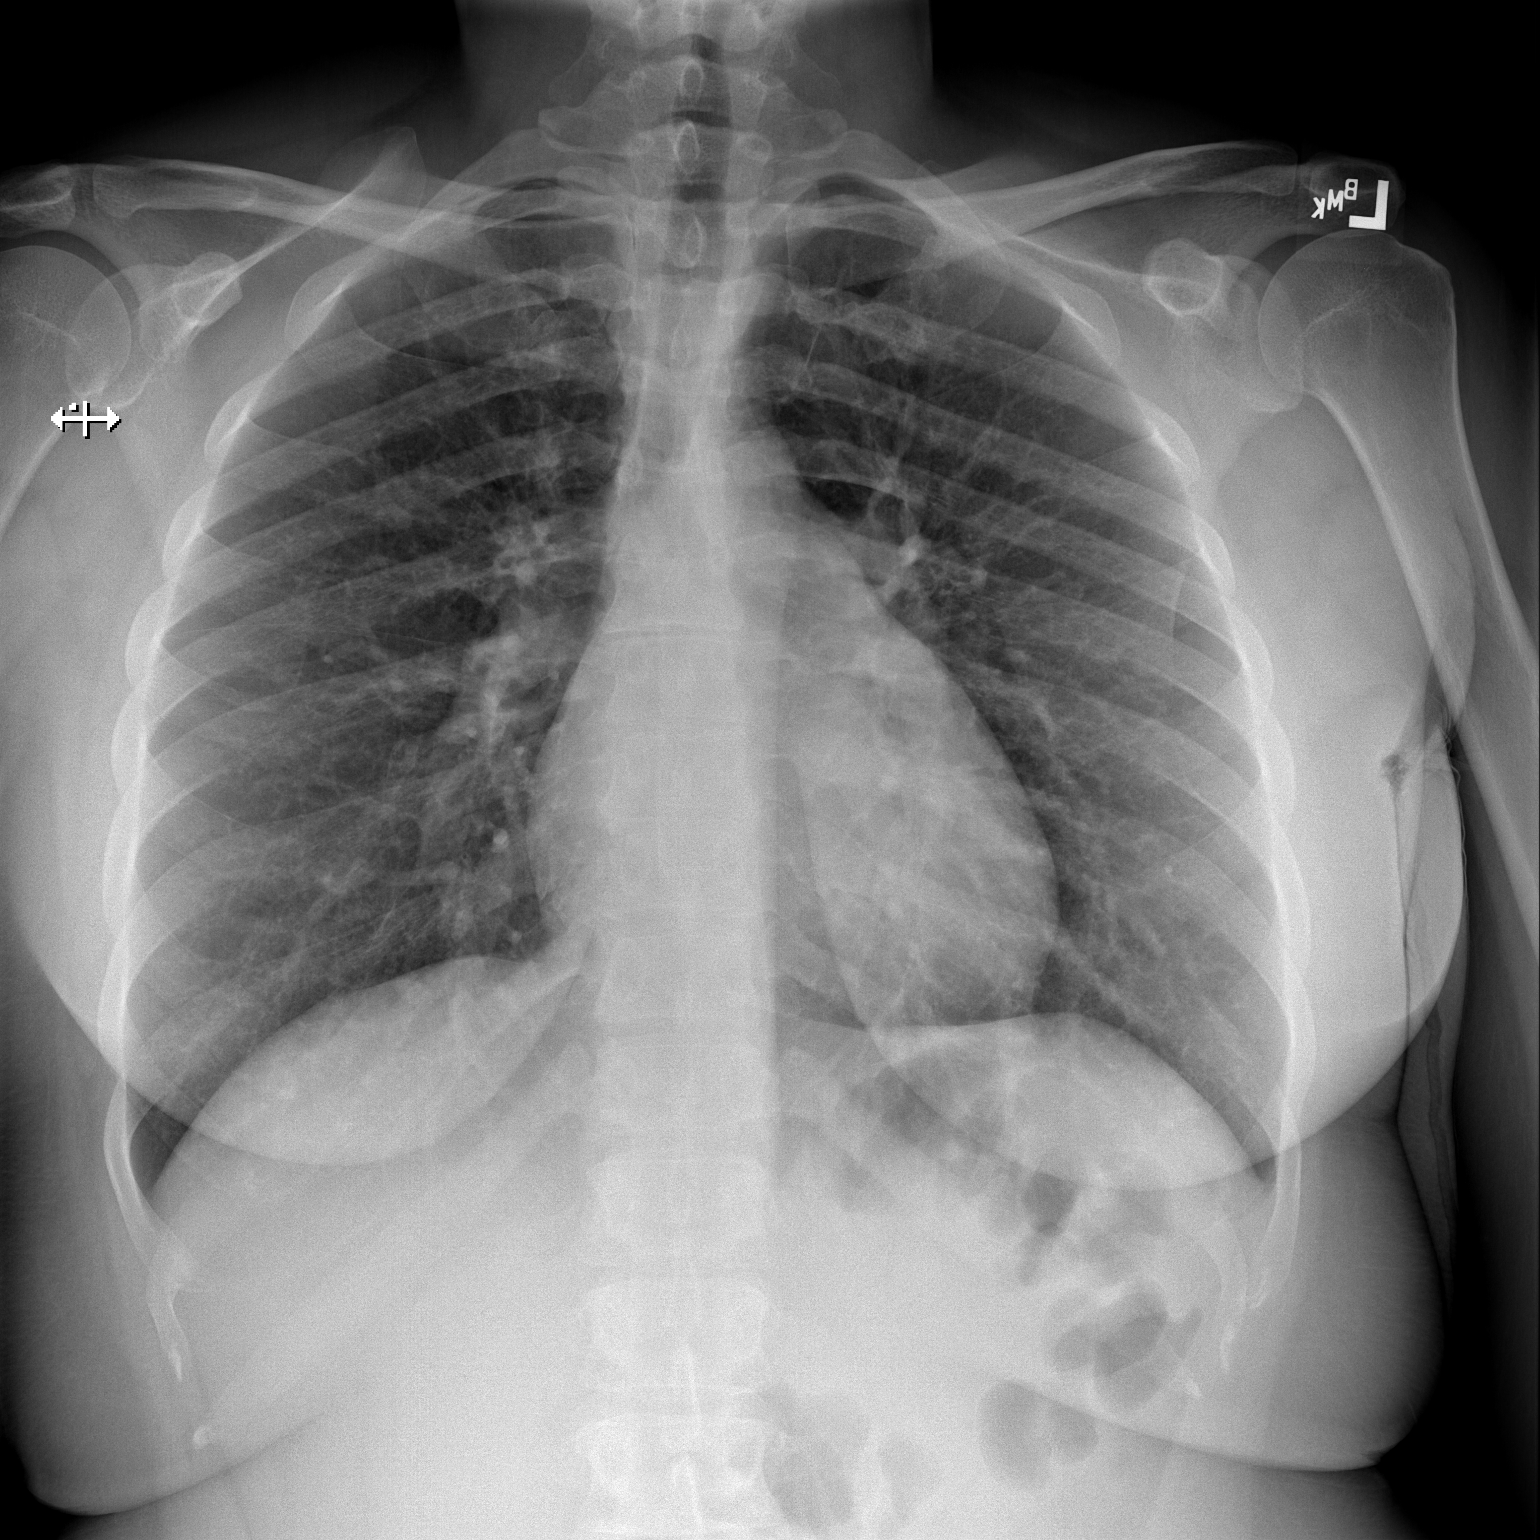
[im 2/2]
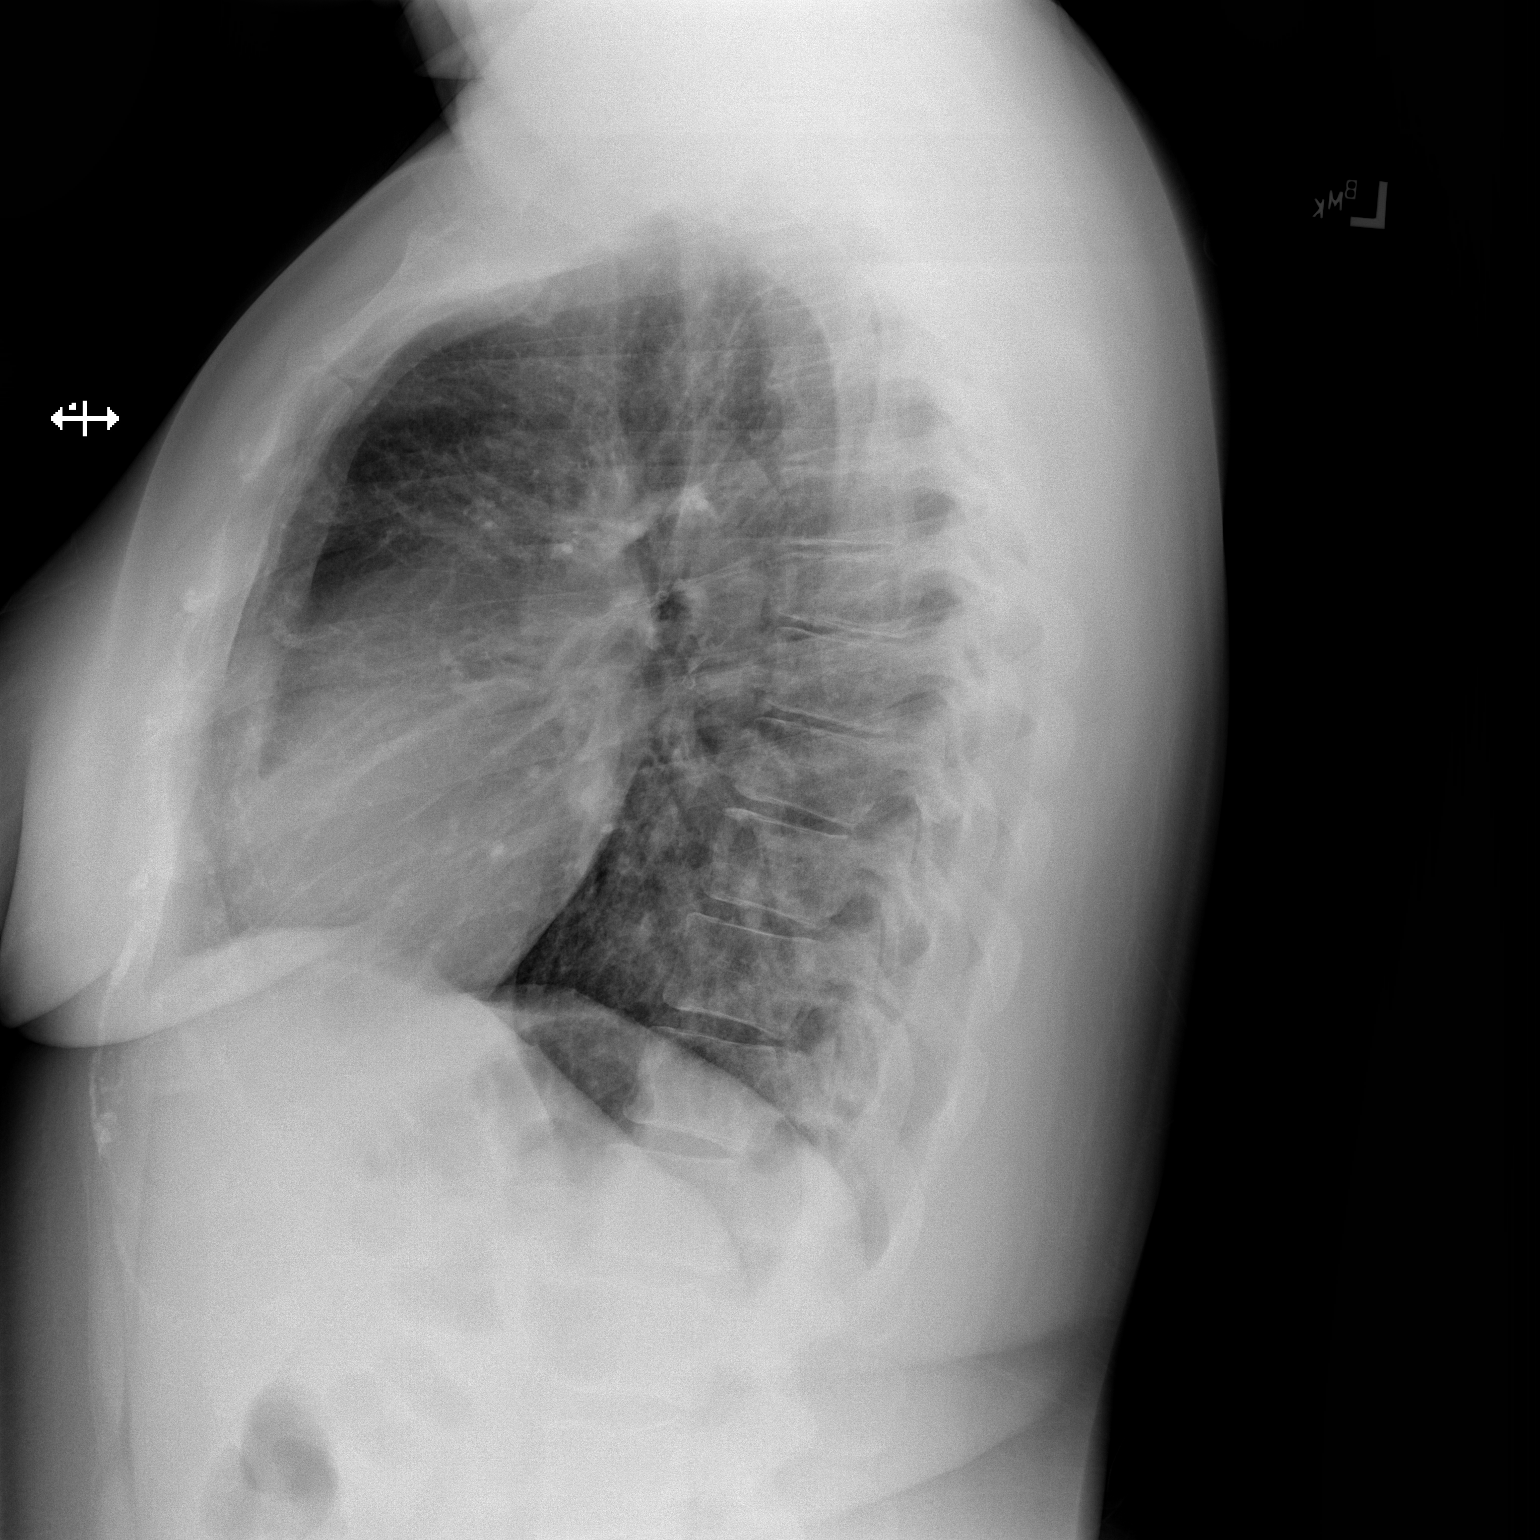

[2 of 2 positions shown; findings below may reference images not displayed]

FINDINGS: Cardiac shadow is within normal limits. The lungs are well aerated
bilaterally. No focal infiltrate or sizable effusion is noted. No
bony abnormality is seen.
IMPRESSION: No active cardiopulmonary disease.

## 2022-07-30 NOTE — L&D Delivery Note (Signed)
Delivery Summary for Jessica Davidson  Labor Events:   Preterm labor: No data found  Rupture date: No data found  Rupture time: No data found  Rupture type: Intact  Fluid Color: No data found  Induction: No data found  Augmentation: No data found  Complications: No data found  Cervical ripening: No data found No data found   No data found     Delivery:   Episiotomy: No data found  Lacerations: No data found  Repair suture: No data found  Repair # of packets: No data found  Blood loss (ml): No data found   Information for the patient's newborn:  Camaya, Lewi [[id number]]   Delivery 07/01/2023 9:25 AM by  C-Section, Low Transverse Sex:  female Gestational Age: [redacted]w[redacted]d Delivery Clinician:   Living?:         APGARS  One minute Five minutes Ten minutes  Skin color:        Heart rate:        Grimace:        Muscle tone:        Breathing:        Totals: 8  9      Presentation/position:      Resuscitation:   Cord information:    Disposition of cord blood:     Blood gases sent?  Complications:   Placenta: Delivered:       appearance Newborn Measurements: Weight: 5 lb 11 oz (2580 g)  Height: 19"  Head circumference:    Chest circumference:    Other providers:    Additional  information: Forceps:   Vacuum:   Breech:   Observed anomalies      Information for the patient's newborn:  Sevynn, Mcconnaughey [[id number]]   Delivery 07/01/2023 9:28 AM by  C-Section, Low Transverse Sex:  female Gestational Age: [redacted]w[redacted]d Delivery Clinician:   Living?:         APGARS  One minute Five minutes Ten minutes  Skin color:        Heart rate:        Grimace:        Muscle tone:        Breathing:        Totals: 7  8      Presentation/position:      Resuscitation:   Cord information:    Disposition of cord blood:     Blood gases sent?  Complications:   Placenta: Delivered:       appearance Newborn Measurements: Weight: 6 lb 2.1 oz (2780 g)  Height: 20.08"  Head circumference:     Chest circumference:    Other providers:    Additional  information: Forceps:   Vacuum:   Breech:   Observed anomalies       See Dr. Oretha Milch operative note for details of C-section procedure.   Jessica Laser, MD [facility]

## 2022-11-04 ENCOUNTER — Ambulatory Visit
Admission: EM | Admit: 2022-11-04 | Discharge: 2022-11-04 | Disposition: A | Discharge status: Home / Self Care | Payer: Medicaid Other | Attending: Family Medicine | Admitting: Family Medicine

## 2022-11-04 DIAGNOSIS — Z1152 Encounter for screening for COVID-19: Secondary | ICD-10-CM | POA: Insufficient documentation

## 2022-11-04 DIAGNOSIS — J0100 Acute maxillary sinusitis, unspecified: Secondary | ICD-10-CM | POA: Insufficient documentation

## 2022-11-04 LAB — SARS CORONAVIRUS 2 BY RT PCR: SARS Coronavirus 2 by RT PCR: NEGATIVE

## 2022-11-04 MED ORDER — AZITHROMYCIN 250 MG PO TABS: 250.0000 mg | ORAL_TABLET | Freq: Every day | ORAL | 0 refills | Status: DC

## 2022-11-04 MED ORDER — FLUCONAZOLE 150 MG PO TABS: 150.0000 mg | ORAL_TABLET | ORAL | 0 refills | Status: AC

## 2022-11-04 NOTE — ED Provider Notes (Signed)
[facility]    CSN: [id number] Arrival date & time: 11/04/22  1316      History   Chief Complaint Chief Complaint  Patient presents with   Nasal Congestion    HPI Jessica Davidson is a 32 y.o. female.   HPI   Jessica Davidson presents for nasal congestion since Friday. She has bad sinuses. She went to blow her nose and it had a bad odor to it.  She has pressure behind her eyes. Endorses headaches on her left side with left side.     Fever : no  Chills: yes Sore throat: no   Cough: no Sputum: no Nasal congestion: yes Rhinorrhea: yes Myalgias: no Appetite: normal  Hydration: normal  Abdominal pain: no Nausea: no Vomiting: no Diarrhea: No Rash: No Sleep disturbance: no Headache: yes     Past Medical History:  Diagnosis Date   Medical history non-contributory     Patient Active Problem List   Diagnosis Date Noted   Labor and delivery, indication for care 04/24/2016   Irregular contractions 04/08/2016    Past Surgical History:  Procedure Laterality Date   CESAREAN SECTION N/A 04/26/2016   Procedure: CESAREAN SECTION;  Surgeon: Vena AustriaAndreas Staebler, MD;  Location: [facility] ORS;  Service: Obstetrics;  Laterality: N/A;   TONSILLECTOMY      OB History     Gravida  1   Para  1   Term      Preterm      AB      Living  1      SAB      IAB      Ectopic      Multiple      Live Births               Home Medications    Prior to Admission medications   Medication Sig Start Date End Date Taking? Authorizing Provider  azithromycin (ZITHROMAX Z-PAK) 250 MG tablet Take 1 tablet (250 mg total) by mouth daily. Take 2 tablets on day 1 11/04/22  Yes Sharlize Hoar, DO  diphenhydrAMINE (BENADRYL) 25 mg capsule Take 25 mg by mouth every 6 (six) hours as needed.   Yes [provider]  fluconazole (DIFLUCAN) 150 MG tablet Take 1 tablet (150 mg total) by mouth every 3 (three) days for 2 doses. 11/04/22 11/08/22 Yes Maayan Jenning, Seward MethVondra, DO  Multiple  Vitamins-Minerals (MULTIVITAMIN) tablet Take 1 tablet by mouth daily. 01/13/20  Yes Federico FlakeNewton, Kimberly Niles, MD  Phenylephrine-Acetaminophen (TYLENOL SINUS CONGESTION/PAIN PO) Take by mouth.   Yes [provider]  cetirizine (ZYRTEC) 10 MG tablet Take 10 mg by mouth daily.    [provider]  ibuprofen (ADVIL) 800 MG tablet Take 1 tablet (800 mg total) by mouth every 8 (eight) hours as needed. 02/05/19   Emily FilbertWilliams, Jonathan E, MD  norgestrel-ethinyl estradiol (LO/OVRAL) 0.3-30 MG-MCG tablet Dispense 4 packages of OCPs, client to take 12 weeks of active pills then 1 week of placebos on 13th week    repeat x 1 yr 3 RF 01/13/20   Larene PickettLatta, Cynthia, FNP  Prenatal Vit-Fe Fumarate-FA (MULTIVITAMIN-PRENATAL) 27-0.8 MG TABS tablet Take 1 tablet by mouth daily at 12 noon.    [provider]    Family History Family History  Problem Relation Age of Onset   Diabetes Mother    Hypertension Mother    Hypertension Father     Social History Social History   Tobacco Use   Smoking status: Every Day  Packs/day: .25    Types: Cigarettes   Smokeless tobacco: Former  Building services engineer Use: Never used  Substance Use Topics   Alcohol use: Yes    Comment: occassionally   Drug use: No     Allergies   Amoxicillin   Review of Systems Review of Systems: negative unless otherwise stated in HPI.      Physical Exam Triage Vital Signs ED Triage Vitals  Enc Vitals Group     BP 11/04/22 1405 108/73     Pulse Rate 11/04/22 1405 88     Resp 11/04/22 1405 18     Temp 11/04/22 1405 98.9 F (37.2 C)     Temp Source 11/04/22 1405 Oral     SpO2 11/04/22 1405 99 %     Weight 11/04/22 1403 230 lb (104.3 kg)     Height 11/04/22 1403 5\' 9"  (1.753 m)     Head Circumference --      Peak Flow --      Pain Score 11/04/22 1402 0     Pain Loc --      Pain Edu? --      Excl. in GC? --    No data found.  Updated Vital Signs BP 108/73 (BP Location: Left Arm)   Pulse 88   Temp 98.9  F (37.2 C) (Oral)   Resp 18   Ht 5\' 9"  (1.753 m)   Wt 104.3 kg   LMP 09/27/2022 (Approximate)   SpO2 99%   BMI 33.97 kg/m   Visual Acuity Right Eye Distance:   Left Eye Distance:   Bilateral Distance:    Right Eye Near:   Left Eye Near:    Bilateral Near:     Physical Exam GEN:     alert, well appearing female in no distress    HENT:  mucus membranes moist, oropharyngeal without erythema, lesions or exudate, no tonsillar hypertrophy,  moderate erythematous edematous turbinates, purulent nasal discharge mostly on the left, left TM bulging without erythema, right TM normal, left maxillary sinus tenderness, no right maxillary sinus tenderness EYES:   pupils equal and reactive, no scleral injection or discharge NECK:  normal ROM, no lymphadenopathy, no meningismus   RESP:  no increased work of breathing, clear to auscultation bilaterally CVS:   regular rate and rhythm Skin:   warm and dry, no rash on visible skin    UC Treatments / Results  Labs (all labs ordered are listed, but only abnormal results are displayed) Labs Reviewed  SARS CORONAVIRUS 2 BY RT PCR    EKG   Radiology No results found.  Procedures Procedures (including critical care time)  Medications Ordered in UC Medications - No data to display  Initial Impression / Assessment and Plan / UC Course  I have reviewed the triage vital signs and the nursing notes.  Pertinent labs & imaging results that were available during my care of the patient were reviewed by me and considered in my medical decision making (see chart for details).       Pt is a 32 y.o. female who presents for 2 days of respiratory symptoms. Jessica Davidson is afebrile here without recent antipyretics. Satting well on room air. Overall pt is non-toxic appearing, well hydrated, without respiratory distress.  She has left maxillary sinus tenderness with left TM bulging concerning for acute maxillary sinusitis.  COVID  was negative.  Treat with  azithromycin and steroids as below.  Patient has history of antibiotic associated yeast infections  therefore Diflucan was sent to the pharmacy.  Typical duration of symptoms discussed.  Symptomatic treatment reviewed.  Return and ED precautions given and voiced understanding. Discussed MDM, treatment plan and plan for follow-up with patient who agrees with plan.     Final Clinical Impressions(s) / UC Diagnoses   Final diagnoses:  Acute non-recurrent maxillary sinusitis     Discharge Instructions      Stop by the pharmacy to pick up your prescriptions.  Follow up with your primary care provider as needed.      ED Prescriptions     Medication Sig Dispense Auth. Provider   azithromycin (ZITHROMAX Z-PAK) 250 MG tablet Take 1 tablet (250 mg total) by mouth daily. Take 2 tablets on day 1 6 tablet Stephane Junkins, DO   fluconazole (DIFLUCAN) 150 MG tablet Take 1 tablet (150 mg total) by mouth every 3 (three) days for 2 doses. 2 tablet Katha Cabal, DO      PDMP not reviewed this encounter.   Katha Cabal, DO 11/04/22 1558

## 2022-11-04 NOTE — Discharge Instructions (Signed)
Stop by the pharmacy to pick up your prescriptions.  Follow up with your primary care provider as needed.  

## 2022-11-04 NOTE — ED Triage Notes (Signed)
X3 days. "I believe I have a sinus infection". Pressure in head "started Friday". "A smell and pus coming out of my nose". No fever. No cough. No sob.

## 2022-11-22 ENCOUNTER — Ambulatory Visit (LOCAL_COMMUNITY_HEALTH_CENTER): Payer: Medicaid Other

## 2022-11-22 VITALS — BP 103/69 | Ht 68.0 in | Wt 228.0 lb

## 2022-11-22 DIAGNOSIS — Z3201 Encounter for pregnancy test, result positive: Secondary | ICD-10-CM | POA: Diagnosis not present

## 2022-11-22 MED ORDER — PRENATAL 27-0.8 MG PO TABS: 1.0000 | ORAL_TABLET | Freq: Every day | ORAL | 0 refills | Status: AC

## 2022-11-22 NOTE — Progress Notes (Signed)
UPT Positive. Plans care at [facility]. Positive preg packet given.   The patient was dispensed prenatal vitamins #100 today per SO Dr Ralene Bathe. I provided counseling today regarding the medication. We discussed the medication, the side effects and when to call clinic. Patient given the opportunity to ask questions. Questions answered.    Sent to [facility] for medicaid/preg women. Jerel Shepherd, RN

## 2022-11-23 LAB — PREGNANCY, URINE: Preg Test, Ur: POSITIVE — AB

## 2022-12-03 ENCOUNTER — Ambulatory Visit: Payer: Medicaid Other

## 2022-12-05 ENCOUNTER — Telehealth: Payer: Self-pay | Admitting: Obstetrics

## 2022-12-05 ENCOUNTER — Ambulatory Visit (INDEPENDENT_AMBULATORY_CARE_PROVIDER_SITE_OTHER): Payer: Medicaid Other

## 2022-12-05 DIAGNOSIS — Z130 Encounter for screening for diseases of the blood and blood-forming organs and certain disorders involving the immune mechanism: Secondary | ICD-10-CM

## 2022-12-05 DIAGNOSIS — Z131 Encounter for screening for diabetes mellitus: Secondary | ICD-10-CM

## 2022-12-05 DIAGNOSIS — Z369 Encounter for antenatal screening, unspecified: Secondary | ICD-10-CM

## 2022-12-05 DIAGNOSIS — Z3480 Encounter for supervision of other normal pregnancy, unspecified trimester: Secondary | ICD-10-CM | POA: Insufficient documentation

## 2022-12-05 DIAGNOSIS — O0990 Supervision of high risk pregnancy, unspecified, unspecified trimester: Secondary | ICD-10-CM | POA: Insufficient documentation

## 2022-12-05 DIAGNOSIS — Z3689 Encounter for other specified antenatal screening: Secondary | ICD-10-CM

## 2022-12-05 NOTE — Patient Instructions (Signed)
First Trimester of Pregnancy  The first trimester of pregnancy starts on the first day of your last menstrual period until the end of week 12. This is also called months 1 through 3 of pregnancy. Body changes during your first trimester Your body goes through many changes during pregnancy. The changes usually return to normal after your baby is born. Physical changes You may gain or lose weight. Your breasts may grow larger and hurt. The area around your nipples may get darker. Dark spots or blotches may develop on your face. You may have changes in your hair. Health changes You may feel like you might vomit (nauseous), and you may vomit. You may have heartburn. You may have headaches. You may have trouble pooping (constipation). Your gums may bleed. Other changes You may get tired easily. You may pee (urinate) more often. Your menstrual periods will stop. You may not feel hungry. You may want to eat certain kinds of food. You may have changes in your emotions from day to day. You may have more dreams. Follow these instructions at home: Medicines Take over-the-counter and prescription medicines only as told by your doctor. Some medicines are not safe during pregnancy. Take a prenatal vitamin that contains at least 600 micrograms (mcg) of folic acid. Eating and drinking Eat healthy meals that include: Fresh fruits and vegetables. Whole grains. Good sources of protein, such as meat, eggs, or tofu. Low-fat dairy products. Avoid raw meat and unpasteurized juice, milk, and cheese. If you feel like you may vomit, or you vomit: Eat 4 or 5 small meals a day instead of 3 large meals. Try eating a few soda crackers. Drink liquids between meals instead of during meals. You may need to take these actions to prevent or treat trouble pooping: Drink enough fluids to keep your pee (urine) pale yellow. Eat foods that are high in fiber. These include beans, whole grains, and fresh fruits and  vegetables. Limit foods that are high in fat and sugar. These include fried or sweet foods. Activity Exercise only as told by your doctor. Most people can do their usual exercise routine during pregnancy. Stop exercising if you have cramps or pain in your lower belly (abdomen) or low back. Do not exercise if it is too hot or too humid, or if you are in a place of great height (high altitude). Avoid heavy lifting. If you choose to, you may have sex unless your doctor tells you not to. Relieving pain and discomfort Wear a good support bra if your breasts are sore. Rest with your legs raised (elevated) if you have leg cramps or low back pain. If you have bulging veins (varicose veins) in your legs: Wear support hose as told by your doctor. Raise your feet for 15 minutes, 3-4 times a day. Limit salt in your food. Safety Wear your seat belt at all times when you are in a car. Talk with your doctor if someone is hurting you or yelling at you. Talk with your doctor if you are feeling sad or have thoughts of hurting yourself. Lifestyle Do not use hot tubs, steam rooms, or saunas. Do not douche. Do not use tampons or scented sanitary pads. Do not use herbal medicines, illegal drugs, or medicines that are not approved by your doctor. Do not drink alcohol. Do not smoke or use any products that contain nicotine or tobacco. If you need help quitting, ask your doctor. Avoid cat litter boxes and soil that is used by cats. These carry   germs that can cause harm to the baby and can cause a loss of your baby by miscarriage or stillbirth. General instructions Keep all follow-up visits. This is important. Ask for help if you need counseling or if you need help with nutrition. Your doctor can give you advice or tell you where to go for help. Visit your dentist. At home, brush your teeth with a soft toothbrush. Floss gently. Write down your questions. Take them to your prenatal visits. Where to find more  information American Pregnancy Association: americanpregnancy.org American College of Obstetricians and Gynecologists: www.acog.org Office on Women's Health: womenshealth.gov/pregnancy Contact a doctor if: You are dizzy. You have a fever. You have mild cramps or pressure in your lower belly. You have a nagging pain in your belly area. You continue to feel like you may vomit, you vomit, or you have watery poop (diarrhea) for 24 hours or longer. You have a bad-smelling fluid coming from your vagina. You have pain when you pee. You are exposed to a disease that spreads from person to person, such as chickenpox, measles, Zika virus, HIV, or hepatitis. Get help right away if: You have spotting or bleeding from your vagina. You have very bad belly cramping or pain. You have shortness of breath or chest pain. You have any kind of injury, such as from a fall or a car crash. You have new or increased pain, swelling, or redness in an arm or leg. Summary The first trimester of pregnancy starts on the first day of your last menstrual period until the end of week 12 (months 1 through 3). Eat 4 or 5 small meals a day instead of 3 large meals. Do not smoke or use any products that contain nicotine or tobacco. If you need help quitting, ask your doctor. Keep all follow-up visits. This information is not intended to replace advice given to you by your health care provider. Make sure you discuss any questions you have with your health care provider. Document Revised: 12/23/2019 Document Reviewed: 10/29/2019 Elsevier Patient Education  2023 Elsevier Inc. Second Trimester of Pregnancy  The second trimester of pregnancy is from week 13 through week 27. This is months 4 through 6 of pregnancy. The second trimester is often a time when you feel your best. Your body has adjusted to being pregnant, and you begin to feel better physically. During the second trimester: Morning sickness has lessened or stopped  completely. You may have more energy. You may have an increase in appetite. The second trimester is also a time when the unborn baby (fetus) is growing rapidly. At the end of the sixth month, the fetus may be up to 12 inches long and weigh about 1 pounds. You will likely begin to feel the baby move (quickening) between 16 and 20 weeks of pregnancy. Body changes during your second trimester Your body continues to go through many changes during your second trimester. The changes vary and generally return to normal after the baby is born. Physical changes Your weight will continue to increase. You will notice your lower abdomen bulging out. You may begin to get stretch marks on your hips, abdomen, and breasts. Your breasts will continue to grow and to become tender. Dark spots or blotches (chloasma or mask of pregnancy) may develop on your face. A dark line from your belly button to the pubic area (linea nigra) may appear. You may have changes in your hair. These can include thickening of your hair, rapid growth, and changes in texture.   Some people also have hair loss during or after pregnancy, or hair that feels dry or thin. Health changes You may develop headaches. You may have heartburn. You may develop constipation. You may develop hemorrhoids or swollen, bulging veins (varicose veins). Your gums may bleed and may be sensitive to brushing and flossing. You may urinate more often because the fetus is pressing on your bladder. You may have back pain. This is caused by: Weight gain. Pregnancy hormones that are relaxing the joints in your pelvis. A shift in weight and the muscles that support your balance. Follow these instructions at home: Medicines Follow your health care provider's instructions regarding medicine use. Specific medicines may be either safe or unsafe to take during pregnancy. Do not take any medicines unless approved by your health care provider. Take a prenatal vitamin  that contains at least 600 micrograms (mcg) of folic acid. Eating and drinking Eat a healthy diet that includes fresh fruits and vegetables, whole grains, good sources of protein such as meat, eggs, or tofu, and low-fat dairy products. Avoid raw meat and unpasteurized juice, milk, and cheese. These carry germs that can harm you and your baby. You may need to take these actions to prevent or treat constipation: Drink enough fluid to keep your urine pale yellow. Eat foods that are high in fiber, such as beans, whole grains, and fresh fruits and vegetables. Limit foods that are high in fat and processed sugars, such as fried or sweet foods. Activity Exercise only as directed by your health care provider. Most people can continue their usual exercise routine during pregnancy. Try to exercise for 30 minutes at least 5 days a week. Stop exercising if you develop contractions in your uterus. Stop exercising if you develop pain or cramping in the lower abdomen or lower back. Avoid exercising if it is very hot or humid or if you are at a high altitude. Avoid heavy lifting. If you choose to, you may have sex unless your health care provider tells you not to. Relieving pain and discomfort Wear a supportive bra to prevent discomfort from breast tenderness. Take warm sitz baths to soothe any pain or discomfort caused by hemorrhoids. Use hemorrhoid cream if your health care provider approves. Rest with your legs raised (elevated) if you have leg cramps or low back pain. If you develop varicose veins: Wear support hose as told by your health care provider. Elevate your feet for 15 minutes, 3-4 times a day. Limit salt in your diet. Safety Wear your seat belt at all times when driving or riding in a car. Talk with your health care provider if someone is verbally or physically abusive to you. Lifestyle Do not use hot tubs, steam rooms, or saunas. Do not douche. Do not use tampons or scented sanitary  pads. Avoid cat litter boxes and soil used by cats. These carry germs that can cause birth defects in the baby and possibly loss of the fetus by miscarriage or stillbirth. Do not use herbal remedies, alcohol, illegal drugs, or medicines that are not approved by your health care provider. Chemicals in these products can harm your baby. Do not use any products that contain nicotine or tobacco, such as cigarettes, e-cigarettes, and chewing tobacco. If you need help quitting, ask your health care provider. General instructions During a routine prenatal visit, your health care provider will do a physical exam and other tests. He or she will also discuss your overall health. Keep all follow-up visits. This is important. Ask   your health care provider for a referral to a local prenatal education class. Ask for help if you have counseling or nutritional needs during pregnancy. Your health care provider can offer advice or refer you to specialists for help with various needs. Where to find more information American Pregnancy Association: americanpregnancy.org American College of Obstetricians and Gynecologists: acog.org/en/Womens%20Health/Pregnancy Office on Women's Health: womenshealth.gov/pregnancy Contact a health care provider if you have: A headache that does not go away when you take medicine. Vision changes or you see spots in front of your eyes. Mild pelvic cramps, pelvic pressure, or nagging pain in the abdominal area. Persistent nausea, vomiting, or diarrhea. A bad-smelling vaginal discharge or foul-smelling urine. Pain when you urinate. Sudden or extreme swelling of your face, hands, ankles, feet, or legs. A fever. Get help right away if you: Have fluid leaking from your vagina. Have spotting or bleeding from your vagina. Have severe abdominal cramping or pain. Have difficulty breathing. Have chest pain. Have fainting spells. Have not felt your baby move for the time period told by your  health care provider. Have new or increased pain, swelling, or redness in an arm or leg. Summary The second trimester of pregnancy is from week 13 through week 27 (months 4 through 6). Do not use herbal remedies, alcohol, illegal drugs, or medicines that are not approved by your health care provider. Chemicals in these products can harm your baby. Exercise only as directed by your health care provider. Most people can continue their usual exercise routine during pregnancy. Keep all follow-up visits. This is important. This information is not intended to replace advice given to you by your health care provider. Make sure you discuss any questions you have with your health care provider. Document Revised: 12/23/2019 Document Reviewed: 10/29/2019 Elsevier Patient Education  2023 Elsevier Inc. Commonly Asked Questions During Pregnancy  Cats: A parasite can be excreted in cat feces.  To avoid exposure you need to have another person empty the little box.  If you must empty the litter box you will need to wear gloves.  Wash your hands after handling your cat.  This parasite can also be found in raw or undercooked meat so this should also be avoided.  Colds, Sore Throats, Flu: Please check your medication sheet to see what you can take for symptoms.  If your symptoms are unrelieved by these medications please call the office.  Dental Work: Most any dental work your dentist recommends is permitted.  X-rays should only be taken during the first trimester if absolutely necessary.  Your abdomen should be shielded with a lead apron during all x-rays.  Please notify your provider prior to receiving any x-rays.  Novocaine is fine; gas is not recommended.  If your dentist requires a note from us prior to dental work please call the office and we will provide one for you.  Exercise: Exercise is an important part of staying healthy during your pregnancy.  You may continue most exercises you were accustomed to  prior to pregnancy.  Later in your pregnancy you will most likely notice you have difficulty with activities requiring balance like riding a bicycle.  It is important that you listen to your body and avoid activities that put you at a higher risk of falling.  Adequate rest and staying well hydrated are a must!  If you have questions about the safety of specific activities ask your provider.    Exposure to Children with illness: Try to avoid obvious exposure; report any   symptoms to us when noted,  If you have chicken pos, red measles or mumps, you should be immune to these diseases.   Please do not take any vaccines while pregnant unless you have checked with your OB provider.  Fetal Movement: After 28 weeks we recommend you do "kick counts" twice daily.  Lie or sit down in a calm quiet environment and count your baby movements "kicks".  You should feel your baby at least 10 times per hour.  If you have not felt 10 kicks within the first hour get up, walk around and have something sweet to eat or drink then repeat for an additional hour.  If count remains less than 10 per hour notify your provider.  Fumigating: Follow your pest control agent's advice as to how long to stay out of your home.  Ventilate the area well before re-entering.  Hemorrhoids:   Most over-the-counter preparations can be used during pregnancy.  Check your medication to see what is safe to use.  It is important to use a stool softener or fiber in your diet and to drink lots of liquids.  If hemorrhoids seem to be getting worse please call the office.   Hot Tubs:  Hot tubs Jacuzzis and saunas are not recommended while pregnant.  These increase your internal body temperature and should be avoided.  Intercourse:  Sexual intercourse is safe during pregnancy as long as you are comfortable, unless otherwise advised by your provider.  Spotting may occur after intercourse; report any bright red bleeding that is heavier than spotting.  Labor:   If you know that you are in labor, please go to the hospital.  If you are unsure, please call the office and let us help you decide what to do.  Lifting, straining, etc:  If your job requires heavy lifting or straining please check with your provider for any limitations.  Generally, you should not lift items heavier than that you can lift simply with your hands and arms (no back muscles)  Painting:  Paint fumes do not harm your pregnancy, but may make you ill and should be avoided if possible.  Latex or water based paints have less odor than oils.  Use adequate ventilation while painting.  Permanents & Hair Color:  Chemicals in hair dyes are not recommended as they cause increase hair dryness which can increase hair loss during pregnancy.  " Highlighting" and permanents are allowed.  Dye may be absorbed differently and permanents may not hold as well during pregnancy.  Sunbathing:  Use a sunscreen, as skin burns easily during pregnancy.  Drink plenty of fluids; avoid over heating.  Tanning Beds:  Because their possible side effects are still unknown, tanning beds are not recommended.  Ultrasound Scans:  Routine ultrasounds are performed at approximately 20 weeks.  You will be able to see your baby's general anatomy an if you would like to know the gender this can usually be determined as well.  If it is questionable when you conceived you may also receive an ultrasound early in your pregnancy for dating purposes.  Otherwise ultrasound exams are not routinely performed unless there is a medical necessity.  Although you can request a scan we ask that you pay for it when conducted because insurance does not cover " patient request" scans.  Work: If your pregnancy proceeds without complications you may work until your due date, unless your physician or employer advises otherwise.  Round Ligament Pain/Pelvic Discomfort:  Sharp, shooting pains not associated   with bleeding are fairly common, usually  occurring in the second trimester of pregnancy.  They tend to be worse when standing up or when you remain standing for long periods of time.  These are the result of pressure of certain pelvic ligaments called "round ligaments".  Rest, Tylenol and heat seem to be the most effective relief.  As the womb and fetus grow, they rise out of the pelvis and the discomfort improves.  Please notify the office if your pain seems different than that described.  It may represent a more serious condition.  Common Medications Safe in Pregnancy  Acne:      Constipation:  Benzoyl Peroxide     Colace  Clindamycin      Dulcolax Suppository  Topica Erythromycin     Fibercon  Salicylic Acid      Metamucil         Miralax AVOID:        Senakot   Accutane    Cough:  Retin-A       Cough Drops  Tetracycline      Phenergan w/ Codeine if Rx  Minocycline      Robitussin (Plain & DM)  Antibiotics:     Crabs/Lice:  Ceclor       RID  Cephalosporins    AVOID:  E-Mycins      Kwell  Keflex  Macrobid/Macrodantin   Diarrhea:  Penicillin      Kao-Pectate  Zithromax      Imodium AD         PUSH FLUIDS AVOID:       Cipro     Fever:  Tetracycline      Tylenol (Regular or Extra  Minocycline       Strength)  Levaquin      Extra Strength-Do not          Exceed 8 tabs/24 hrs Caffeine:        <200mg/day (equiv. To 1 cup of coffee or  approx. 3 12 oz sodas)         Gas: Cold/Hayfever:       Gas-X  Benadryl      Mylicon  Claritin       Phazyme  **Claritin-D        Chlor-Trimeton    Headaches:  Dimetapp      ASA-Free Excedrin  Drixoral-Non-Drowsy     Cold Compress  Mucinex (Guaifenasin)     Tylenol (Regular or Extra  Sudafed/Sudafed-12 Hour     Strength)  **Sudafed PE Pseudoephedrine   Tylenol Cold & Sinus     Vicks Vapor Rub  Zyrtec  **AVOID if Problems With Blood Pressure         Heartburn: Avoid lying down for at least 1 hour after meals  Aciphex      Maalox     Rash:  Milk of  Magnesia     Benadryl    Mylanta       1% Hydrocortisone Cream  Pepcid  Pepcid Complete   Sleep Aids:  Prevacid      Ambien   Prilosec       Benadryl  Rolaids       Chamomile Tea  Tums (Limit 4/day)     Unisom         Tylenol PM         Warm milk-add vanilla or  Hemorrhoids:       Sugar for taste  Anusol/Anusol H.C.  (RX: Analapram 2.5%)  Sugar Substitutes:    Hydrocortisone OTC     Ok in moderation  Preparation H      Tucks        Vaseline lotion applied to tissue with wiping    Herpes:     Throat:  Acyclovir      Oragel  Famvir  Valtrex     Vaccines:         Flu Shot Leg Cramps:       *Gardasil  Benadryl      Hepatitis A         Hepatitis B Nasal Spray:       Pneumovax  Saline Nasal Spray     Polio Booster         Tetanus Nausea:       Tuberculosis test or PPD  Vitamin B6 25 mg TID   AVOID:    Dramamine      *Gardasil  Emetrol       Live Poliovirus  Ginger Root 250 mg QID    MMR (measles, mumps &  High Complex Carbs @ Bedtime    rebella)  Sea Bands-Accupressure    Varicella (Chickenpox)  Unisom 1/2 tab TID     *No known complications           If received before Pain:         Known pregnancy;   Darvocet       Resume series after  Lortab        Delivery  Percocet    Yeast:   Tramadol      Femstat  Tylenol 3      Gyne-lotrimin  Ultram       Monistat  Vicodin           MISC:         All Sunscreens           Hair Coloring/highlights          Insect Repellant's          (Including DEET)         Mystic Tans  

## 2022-12-05 NOTE — Progress Notes (Signed)
New OB Intake  I connected with  Jessica Davidson on 12/05/22 at  3:15 PM EDT by telephone and verified that I am speaking with the correct person using two identifiers. Nurse is located at [facility] and pt is located at home.  I explained I am completing New OB Intake today. We discussed her EDD of 06/12/2023 that is based on LMP of 09/05/2022. Pt is G3/P1011. I reviewed her allergies, medications, Medical/Surgical/OB history, and appropriate screenings. There are cats no in the home.  Based on history, this is a/an pregnancy uncomplicated .   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  Adv will get that sent to her supervisor - pt gave contact information [facility] [place], fax#[phone])  Delivery Plans:  Plans to deliver at [facility].  Anatomy US Explained first scheduled [place] will be scheduled soon and an anatomy scan will be done at 20 weeks.  Labs Discussed genetic screening with patient. Patient desires genetic testing to be drawn with new OB labs. Discussed possible labs to be drawn at new OB appointment.  COVID Vaccine Patient has not had COVID vaccine.   Social Determinants of Health Food Insecurity: denies food insecurity Transportation: Patient denies transportation needs. Childcare: Discussed no children allowed at ultrasound appointments.   First visit review I reviewed new OB appt with pt. I explained she will have ob bloodwork and pap smear/pelvic exam if indicated. Explained pt will be seen by Paula Compton, CNM at first visit; encounter routed to appropriate provider.   Loran Senters, [facility] 12/05/2022  3:42 PM

## 2022-12-05 NOTE — Telephone Encounter (Signed)
Reached out to pt to schedule a dating scan.  Left message for pt to call back.

## 2022-12-05 NOTE — Progress Notes (Signed)
Reached out to pt to schedule dating scan.  Left message for pt to call back.   

## 2022-12-07 NOTE — Telephone Encounter (Signed)
The patient is scheduled 5/23 for dating scan.

## 2022-12-17 ENCOUNTER — Other Ambulatory Visit: Payer: Medicaid Other

## 2022-12-20 ENCOUNTER — Ambulatory Visit (INDEPENDENT_AMBULATORY_CARE_PROVIDER_SITE_OTHER): Payer: Medicaid Other

## 2022-12-20 ENCOUNTER — Other Ambulatory Visit: Payer: Self-pay | Admitting: Obstetrics

## 2022-12-20 DIAGNOSIS — Z3480 Encounter for supervision of other normal pregnancy, unspecified trimester: Secondary | ICD-10-CM

## 2022-12-20 DIAGNOSIS — Z3A09 9 weeks gestation of pregnancy: Secondary | ICD-10-CM

## 2022-12-20 DIAGNOSIS — Z3687 Encounter for antenatal screening for uncertain dates: Secondary | ICD-10-CM

## 2022-12-20 DIAGNOSIS — O30001 Twin pregnancy, unspecified number of placenta and unspecified number of amniotic sacs, first trimester: Secondary | ICD-10-CM

## 2022-12-20 DIAGNOSIS — Z369 Encounter for antenatal screening, unspecified: Secondary | ICD-10-CM

## 2022-12-20 DIAGNOSIS — Z130 Encounter for screening for diseases of the blood and blood-forming organs and certain disorders involving the immune mechanism: Secondary | ICD-10-CM

## 2022-12-20 DIAGNOSIS — Z131 Encounter for screening for diabetes mellitus: Secondary | ICD-10-CM

## 2022-12-25 ENCOUNTER — Other Ambulatory Visit (HOSPITAL_COMMUNITY)
Admission: RE | Admit: 2022-12-25 | Discharge: 2022-12-25 | Disposition: A | Discharge status: Home / Self Care | Payer: Medicaid Other | Source: Ambulatory Visit | Attending: Obstetrics | Admitting: Obstetrics

## 2022-12-25 ENCOUNTER — Encounter: Payer: Medicaid Other | Admitting: Obstetrics

## 2022-12-25 ENCOUNTER — Other Ambulatory Visit: Payer: Medicaid Other

## 2022-12-25 DIAGNOSIS — Z130 Encounter for screening for diseases of the blood and blood-forming organs and certain disorders involving the immune mechanism: Secondary | ICD-10-CM

## 2022-12-25 DIAGNOSIS — Z3480 Encounter for supervision of other normal pregnancy, unspecified trimester: Secondary | ICD-10-CM | POA: Diagnosis present

## 2022-12-25 DIAGNOSIS — Z369 Encounter for antenatal screening, unspecified: Secondary | ICD-10-CM | POA: Insufficient documentation

## 2022-12-25 DIAGNOSIS — Z131 Encounter for screening for diabetes mellitus: Secondary | ICD-10-CM

## 2022-12-26 LAB — MICROSCOPIC EXAMINATION
Casts: NONE SEEN /lpf
Epithelial Cells (non renal): >10 /hpf — AB (ref 0–10)
RBC, Urine: NONE SEEN /hpf (ref 0–2)

## 2022-12-26 LAB — URINALYSIS, ROUTINE W REFLEX MICROSCOPIC
Bilirubin, UA: NEGATIVE
Glucose, UA: NEGATIVE
Ketones, UA: NEGATIVE
Nitrite, UA: NEGATIVE
RBC, UA: NEGATIVE
Specific Gravity, UA: 1.017 (ref 1.005–1.030)
Urobilinogen, Ur: 0.2 mg/dL (ref 0.2–1.0)
pH, UA: 7.5 (ref 5.0–7.5)

## 2022-12-27 LAB — URINE CYTOLOGY ANCILLARY ONLY
Chlamydia: NEGATIVE
Comment: NEGATIVE
Comment: NORMAL
Neisseria Gonorrhea: NEGATIVE

## 2022-12-27 LAB — CULTURE, OB URINE

## 2022-12-27 LAB — URINE CULTURE, OB REFLEX

## 2022-12-28 LAB — CBC/D/PLT+RPR+RH+ABO+RUBIGG...
Antibody Screen: NEGATIVE
Basophils Absolute: 0 10*3/uL (ref 0.0–0.2)
Basos: 0 %
EOS (ABSOLUTE): 0 10*3/uL (ref 0.0–0.4)
Eos: 0 %
HCV Ab: NONREACTIVE
HIV Screen 4th Generation wRfx: NONREACTIVE
Hematocrit: 35.9 % (ref 34.0–46.6)
Hemoglobin: 12 g/dL (ref 11.1–15.9)
Hepatitis B Surface Ag: NEGATIVE
Immature Grans (Abs): 0 10*3/uL (ref 0.0–0.1)
Immature Granulocytes: 0 %
Lymphocytes Absolute: 1.3 10*3/uL (ref 0.7–3.1)
Lymphs: 28 %
MCH: 28.2 pg (ref 26.6–33.0)
MCHC: 33.4 g/dL (ref 31.5–35.7)
MCV: 84 fL (ref 79–97)
Monocytes Absolute: 0.2 10*3/uL (ref 0.1–0.9)
Monocytes: 5 %
Neutrophils Absolute: 3 10*3/uL (ref 1.4–7.0)
Neutrophils: 67 %
Platelets: 231 10*3/uL (ref 150–450)
RBC: 4.26 x10E6/uL (ref 3.77–5.28)
RDW: 15.1 % (ref 11.7–15.4)
RPR Ser Ql: NONREACTIVE
Rh Factor: POSITIVE
Rubella Antibodies, IGG: 1.23 index (ref >0.99)
Varicella zoster IgG: 1619 index (ref >165)
WBC: 4.6 10*3/uL (ref 3.4–10.8)

## 2022-12-28 LAB — HGB FRACTIONATION CASCADE
Hgb A2: 2.1 % (ref 1.8–3.2)
Hgb A: 97.9 % (ref 96.4–98.8)
Hgb F: 0 % (ref 0.0–2.0)
Hgb S: 0 %

## 2022-12-28 LAB — HEMOGLOBIN A1C
Est. average glucose Bld gHb Est-mCnc: 105 mg/dL
Hgb A1c MFr Bld: 5.3 % (ref 4.8–5.6)

## 2022-12-28 LAB — HCV INTERPRETATION

## 2022-12-29 LAB — MONITOR DRUG PROFILE 14(MW)
Amphetamine Scrn, Ur: NEGATIVE ng/mL
BARBITURATE SCREEN URINE: NEGATIVE ng/mL
BENZODIAZEPINE SCREEN, URINE: NEGATIVE ng/mL
Buprenorphine, Urine: NEGATIVE ng/mL
Cocaine (Metab) Scrn, Ur: NEGATIVE ng/mL
Creatinine(Crt), U: 84.9 mg/dL (ref 20.0–300.0)
Fentanyl, Urine: NEGATIVE pg/mL
Meperidine Screen, Urine: NEGATIVE ng/mL
Methadone Screen, Urine: NEGATIVE ng/mL
OXYCODONE+OXYMORPHONE UR QL SCN: NEGATIVE ng/mL
Opiate Scrn, Ur: NEGATIVE ng/mL
Ph of Urine: 7.5 (ref 4.5–8.9)
Phencyclidine Qn, Ur: NEGATIVE ng/mL
Propoxyphene Scrn, Ur: NEGATIVE ng/mL
SPECIFIC GRAVITY: 1.02
Tramadol Screen, Urine: NEGATIVE ng/mL

## 2022-12-29 LAB — CANNABINOID (GC/MS), URINE
Cannabinoid: POSITIVE — AB
Carboxy THC (GC/MS): >750 ng/mL

## 2022-12-29 LAB — NICOTINE SCREEN, URINE: Cotinine Ql Scrn, Ur: POSITIVE ng/mL — AB

## 2022-12-30 ENCOUNTER — Encounter: Payer: Self-pay | Admitting: Obstetrics

## 2022-12-30 DIAGNOSIS — F1290 Cannabis use, unspecified, uncomplicated: Secondary | ICD-10-CM | POA: Insufficient documentation

## 2023-01-15 ENCOUNTER — Ambulatory Visit (INDEPENDENT_AMBULATORY_CARE_PROVIDER_SITE_OTHER): Payer: Medicaid Other | Admitting: Obstetrics

## 2023-01-15 ENCOUNTER — Other Ambulatory Visit: Payer: Self-pay | Admitting: Obstetrics

## 2023-01-15 ENCOUNTER — Other Ambulatory Visit (HOSPITAL_COMMUNITY)
Admission: RE | Admit: 2023-01-15 | Discharge: 2023-01-15 | Disposition: A | Discharge status: Home / Self Care | Payer: Medicaid Other | Source: Ambulatory Visit | Attending: Obstetrics | Admitting: Obstetrics

## 2023-01-15 ENCOUNTER — Encounter: Payer: Self-pay | Admitting: Obstetrics

## 2023-01-15 VITALS — BP 105/70 | HR 69 | Wt 219.0 lb

## 2023-01-15 DIAGNOSIS — Z3A13 13 weeks gestation of pregnancy: Secondary | ICD-10-CM | POA: Diagnosis not present

## 2023-01-15 DIAGNOSIS — Z3481 Encounter for supervision of other normal pregnancy, first trimester: Secondary | ICD-10-CM | POA: Diagnosis not present

## 2023-01-15 DIAGNOSIS — Z124 Encounter for screening for malignant neoplasm of cervix: Secondary | ICD-10-CM | POA: Insufficient documentation

## 2023-01-15 DIAGNOSIS — Z3480 Encounter for supervision of other normal pregnancy, unspecified trimester: Secondary | ICD-10-CM

## 2023-01-15 DIAGNOSIS — Z1379 Encounter for other screening for genetic and chromosomal anomalies: Secondary | ICD-10-CM

## 2023-01-15 LAB — POCT URINALYSIS DIPSTICK OB
Bilirubin, UA: NEGATIVE
Blood, UA: NEGATIVE
Glucose, UA: NEGATIVE
Ketones, UA: NEGATIVE
Leukocytes, UA: NEGATIVE
Nitrite, UA: NEGATIVE
POC,PROTEIN,UA: NEGATIVE
Spec Grav, UA: 1.01 (ref 1.010–1.025)
Urobilinogen, UA: 0.2 E.U./dL
pH, UA: 6 (ref 5.0–8.0)

## 2023-01-15 MED ORDER — ONDANSETRON HCL 4 MG PO TABS: 4.0000 mg | ORAL_TABLET | Freq: Three times a day (TID) | ORAL | 1 refills | Status: DC | PRN

## 2023-01-15 MED ORDER — ASPIRIN 81 MG PO TBEC: 162.0000 mg | DELAYED_RELEASE_TABLET | Freq: Every day | ORAL | 3 refills | Status: DC

## 2023-01-15 NOTE — Progress Notes (Signed)
NEW OB HISTORY AND PHYSICAL  SUBJECTIVE:       Jessica Davidson is a 32 y.o. G64P1011 female, Patient's last menstrual period was 09/05/2022 (within days)., Estimated Date of Delivery: 07/21/23, [redacted]w[redacted]d, confirmed by 9-week [place], presents today for establishment of Prenatal Care. She desires a repeat cesarean birth. She reports nausea and reflux.   Social history Partner/Relationship: FOB involved Living situation: lives with partner and daughter Work: Education administrator in a warehouse Exercise: walking Substance use: +MJ/nicotine. Quitting.   Gynecologic History Patient's last menstrual period was 09/05/2022 (within days). Normal Contraception: none Last Pap: 2021. Results were: normal  Obstetric History OB History  Gravida Para Term Preterm AB Living  3 1 1  0 1 1  SAB IAB Ectopic Multiple Live Births  1 0 0 0 1    # Outcome Date GA Lbr Len/2nd Weight Sex Delivery Anes PTL Lv  3 Current           2 Term 04/26/16 [redacted]w[redacted]d  8 lb 9 oz (3.884 kg) F CS-Vac  N LIV     Birth Comments: umbilical cord wrapped around baby's neck     Complications: Fetal Intolerance  1 SAB 2013            Past Medical History:  Diagnosis Date   Medical history non-contributory     Past Surgical History:  Procedure Laterality Date   CESAREAN SECTION N/A 04/26/2016   Procedure: CESAREAN SECTION;  Surgeon: Vena Austria, MD;  Location: [facility] ORS;  Service: Obstetrics;  Laterality: N/A;   TONSILLECTOMY     WISDOM TOOTH EXTRACTION  2018   four    Current Outpatient Medications on File Prior to Visit  Medication Sig Dispense Refill   Prenatal Vit-Fe Fumarate-FA (MULTIVITAMIN-PRENATAL) 27-0.8 MG TABS tablet Take 1 tablet by mouth daily at 12 noon. 100 tablet 0   No current facility-administered medications on file prior to visit.    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  Tobacco Use   Smoking status: Former    Packs/day: .25    Types: Cigarettes    Quit date: 11/15/2022    Years since quitting: 0.1   Smokeless tobacco: Never  Vaping Use   Vaping Use: Never used  Substance and Sexual Activity   Alcohol use: Not Currently    Alcohol/week: 4.0 standard drinks of alcohol    Types: 4 Shots of liquor per week    Comment: last use 10/29/22- tequila "3 to 4 shots"   Drug use: No   Sexual activity: Yes    Partners: Male    Birth control/protection: None    Comment: stopped ocp 2022 / hx nexplanon 2017- "constant bleeding"  Other Topics Concern   Not on file  Social History Narrative   Not on file   Social Determinants of Health   Financial Resource Strain: Low Risk  (12/05/2022)   Overall Financial Resource Strain (CARDIA)    Difficulty of Paying Living Expenses: Not hard at all  Food Insecurity: No Food Insecurity (12/05/2022)   Hunger Vital Sign    Worried About Running Out of Food in the Last Year: Never true    Ran Out of Food in the Last Year:  Never true  Transportation Needs: No Transportation Needs (12/05/2022)   PRAPARE - Administrator, Civil Service (Medical): No    Lack of Transportation (Non-Medical): No  Physical Activity: Sufficiently Active (12/05/2022)   Exercise Vital Sign    Days of Exercise per Week: 7 days    Minutes of Exercise per Session: 40 min  Stress: No Stress Concern Present (12/05/2022)   [employer] of Occupational Health - Occupational Stress Questionnaire    Feeling of Stress : Not at all  Social Connections: Moderately Integrated (12/05/2022)   Social Connection and Isolation Panel [NHANES]    Frequency of Communication with Friends and Family: More than three times a week    Frequency of Social Gatherings with Friends and Family: More than three times a week    Attends Religious Services: More  than 4 times per year    Active Member of [employer] or Organizations: No    Attends Banker Meetings: Never    Marital Status: Living with partner  Intimate Partner Violence: Not At Risk (12/05/2022)   Humiliation, Afraid, Rape, and Kick questionnaire    Fear of Current or Ex-Partner: No    Emotionally Abused: No    Physically Abused: No    Sexually Abused: No    Family History  Problem Relation Age of Onset   Diabetes Mother    Hypertension Mother    Hypertension Father    Healthy Sister    Healthy Sister    Healthy Sister    Healthy Brother    Cancer Maternal Grandmother 30       colon   Multiple sclerosis Maternal Grandfather    Hypertension Paternal Grandmother    Diabetes Paternal Grandfather     The following portions of the patient's history were reviewed and updated as appropriate: allergies, current medications, past OB history, past medical history, past surgical history, past family history, past social history, and problem list.  Constitutional: Denied constitutional symptoms, night sweats, recent illness, fatigue, fever, insomnia and weight loss.  Eyes: Denied eye symptoms, eye pain, photophobia, vision change and visual disturbance.  Ears/Nose/Throat/Neck: Denied ear, nose, throat or neck symptoms, hearing loss, nasal discharge, sinus congestion and sore throat.  Cardiovascular: Denied cardiovascular symptoms, arrhythmia, chest pain/pressure, edema, exercise intolerance, orthopnea and palpitations.  Respiratory: Denied pulmonary symptoms, asthma, pleuritic pain, productive sputum, cough, dyspnea and wheezing.  Gastrointestinal: Denied, gastro-esophageal reflux, melena, +nausea and vomiting.  Genitourinary: Denied genitourinary symptoms including symptomatic vaginal discharge, pelvic relaxation issues, and urinary complaints.  Musculoskeletal: Denied musculoskeletal symptoms, stiffness, swelling, muscle weakness and myalgia.  Dermatologic: Denied dermatology  symptoms, rash and scar.  Neurologic: Denied neurology symptoms, dizziness, headache, neck pain and syncope.  Psychiatric: Denied psychiatric symptoms, anxiety and depression.  Endocrine: Denied endocrine symptoms including hot flashes and night sweats.     OBJECTIVE: Initial Physical Exam (New OB)  GENERAL APPEARANCE: alert, well appearing, in no apparent distress, oriented to person, place and time HEAD: normocephalic, atraumatic MOUTH: mucous membranes moist, pharynx normal without lesions THYROID: no thyromegaly or masses present BREASTS: no masses noted, no significant tenderness, no palpable axillary nodes, no skin changes LUNGS: clear to auscultation, no wheezes, rales or rhonchi, symmetric air entry HEART: regular rate and rhythm, no murmurs ABDOMEN: soft, nontender, nondistended, no abnormal masses, no epigastric pain and FHT present (148bpm/160 bpm) EXTREMITIES: no redness or tenderness in the calves or thighs SKIN: normal coloration and turgor, no rashes LYMPH NODES: no adenopathy palpable NEUROLOGIC: alert, oriented, normal speech,  no focal findings or movement disorder noted  PELVIC EXAM EXTERNAL GENITALIA: normal appearing vulva with no masses, tenderness or lesions VAGINA: no abnormal discharge or lesions CERVIX: no lesions or cervical motion tenderness and Pap collected  ASSESSMENT: Di-Di Twin Gestation [redacted]w[redacted]d    PLAN: Di-di twin prenatal care. We discussed an overview of prenatal care for twins and when to call. Reviewed diet, exercise, and weight gain recommendations in pregnancy. Discussed benefits of breastfeeding and lactation resources at [facility]. I reviewed labs and answered all questions. Kynlie desires genetic screening today.  Serial growth scans starting at 24 weeks NSTs in 3rd trimester Delivery by 38 weeks ASA 162 mg ordered See orders  [name], CNM

## 2023-01-17 ENCOUNTER — Other Ambulatory Visit: Payer: Self-pay | Admitting: Obstetrics

## 2023-01-17 DIAGNOSIS — A5901 Trichomonal vulvovaginitis: Secondary | ICD-10-CM | POA: Insufficient documentation

## 2023-01-17 LAB — CYTOLOGY - PAP
Comment: NEGATIVE
Diagnosis: NEGATIVE
High risk HPV: NEGATIVE

## 2023-01-17 MED ORDER — METRONIDAZOLE 500 MG PO TABS
500.0000 mg | ORAL_TABLET | Freq: Two times a day (BID) | ORAL | 0 refills | Status: DC
Start: 2023-01-17 — End: 2023-03-12

## 2023-01-20 LAB — MATERNIT 21 PLUS CORE, BLOOD
Fetal Fraction: 11
Result (T21): NEGATIVE
Trisomy 13 (Patau syndrome): NEGATIVE
Trisomy 18 (Edwards syndrome): NEGATIVE
Trisomy 21 (Down syndrome): NEGATIVE

## 2023-01-24 ENCOUNTER — Telehealth: Payer: Self-pay | Admitting: Obstetrics and Gynecology

## 2023-01-24 DIAGNOSIS — Z0289 Encounter for other administrative examinations: Secondary | ICD-10-CM

## 2023-01-24 NOTE — Telephone Encounter (Signed)
Called the patient to ask her if she could come back by the office to complete the two sheets for [place] to fill out FMLA Paperwork.

## 2023-01-25 ENCOUNTER — Encounter: Payer: Self-pay | Admitting: Obstetrics

## 2023-02-12 ENCOUNTER — Encounter: Payer: Self-pay | Admitting: Licensed Practical Nurse

## 2023-02-12 ENCOUNTER — Ambulatory Visit (INDEPENDENT_AMBULATORY_CARE_PROVIDER_SITE_OTHER): Payer: Medicaid Other | Admitting: Licensed Practical Nurse

## 2023-02-12 VITALS — BP 99/69 | HR 84 | Wt 225.1 lb

## 2023-02-12 DIAGNOSIS — Z3A17 17 weeks gestation of pregnancy: Secondary | ICD-10-CM

## 2023-02-12 DIAGNOSIS — O0990 Supervision of high risk pregnancy, unspecified, unspecified trimester: Secondary | ICD-10-CM

## 2023-02-12 DIAGNOSIS — Z3480 Encounter for supervision of other normal pregnancy, unspecified trimester: Secondary | ICD-10-CM

## 2023-02-12 DIAGNOSIS — Z3482 Encounter for supervision of other normal pregnancy, second trimester: Secondary | ICD-10-CM

## 2023-02-12 LAB — POCT URINALYSIS DIPSTICK
Bilirubin, UA: NEGATIVE
Blood, UA: NEGATIVE
Glucose, UA: NEGATIVE
Ketones, UA: NEGATIVE
Leukocytes, UA: NEGATIVE
Nitrite, UA: NEGATIVE
Protein, UA: NEGATIVE
Spec Grav, UA: 1.02 (ref 1.010–1.025)
Urobilinogen, UA: 0.2 E.U./dL
pH, UA: 7 (ref 5.0–8.0)

## 2023-02-12 NOTE — Progress Notes (Signed)
Routine Prenatal Care Visit  Subjective  Jessica Davidson is a 32 y.o. G3P1011 at [redacted]w[redacted]d being seen today for ongoing prenatal care.  She is currently monitored for the following issues for this high-risk pregnancy and has Irregular contractions; Labor and delivery, indication for care; Supervision of other normal pregnancy, antepartum; Marijuana use during pregnancy; and Trichomonal vaginitis during pregnancy in first trimester on their problem list.  ----------------------------------------------------------------------------------- Patient reports feeling "pregnant", has a little heartburn Here with her partner and her 6y/o daughter.  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 Vag. Bleeding: None.  Movement: Present. Leaking Fluid denies.  ----------------------------------------------------------------------------------- The following portions of the patient's history were reviewed and updated as appropriate: allergies, current medications, past family history, past medical history, past social history, past surgical history and problem list. Problem list updated.  Objective  Blood pressure 99/69, pulse 84, weight 225 lb 1.6 oz (102.1 kg), last menstrual period 09/05/2022. Pregravid weight 220 lb (99.8 kg) Total Weight Gain 5 lb 1.6 oz (2.313 kg) Urinalysis: Urine Protein    Urine Glucose    Fetal Status: Fetal Heart Rate (bpm): 150, 160   Movement: Present     General:  Alert, oriented and cooperative. Patient is in no acute distress.  Skin: Skin is warm and dry. No rash noted.   Cardiovascular: Normal heart rate noted  Respiratory: Normal respiratory effort, no problems with respiration noted  Abdomen: Soft, gravid, appropriate for gestational age. Pain/Pressure: Absent     Pelvic:  Cervical exam  deferred        Extremities: Normal range of motion.  Edema: Trace  Mental Status: Normal mood and affect. Normal behavior. Normal judgment and thought content.   Assessment   32 y.o. G3P1011 at [redacted]w[redacted]d by  07/21/2023, by Ultrasound presenting for routine prenatal visit  Plan   third Problems (from 12/05/22 to present)     Problem Noted Resolved   Supervision of other normal pregnancy, antepartum 12/05/2022 by Loran Senters, CMA No   Overview Addendum 12/05/2022  3:42 PM by Loran Senters, CMA     Clinical Staff Provider  Office Location  [facility] Dating  Not found.  Language  English Anatomy US    Flu Vaccine  offer Genetic Screen  NIPS:   TDaP vaccine   offer Hgb A1C or  GTT Early : Third trimester :   Covid declined   LAB RESULTS   Rhogam     Blood Type     Feeding Plan breast Antibody    Contraception pill Rubella    Circumcision yes RPR     Pediatrician  undecided HBsAg     Support Person Latonio HIV    Prenatal Classes no Varicella     GBS  (For PCN allergy, check sensitivities)   BTL Consent  Hep C     VBAC Consent  Pap No results found for: "DIAGPAP"    Hgb Electro      CF      SMA                    Preterm labor symptoms and general obstetric precautions including but not limited to vaginal bleeding, contractions, leaking of fluid and fetal movement were reviewed in detail with the patient. Please refer to After Visit  Summary for other counseling recommendations.   Return in about 4 weeks (around 03/12/2023) for ROB.  Anatomy US ordered  Jannifer Hick  [facility]  02/12/23  4:22 PM

## 2023-02-28 ENCOUNTER — Ambulatory Visit (INDEPENDENT_AMBULATORY_CARE_PROVIDER_SITE_OTHER): Payer: Medicaid Other

## 2023-02-28 DIAGNOSIS — Z3689 Encounter for other specified antenatal screening: Secondary | ICD-10-CM

## 2023-02-28 DIAGNOSIS — Z3A19 19 weeks gestation of pregnancy: Secondary | ICD-10-CM

## 2023-02-28 DIAGNOSIS — O0990 Supervision of high risk pregnancy, unspecified, unspecified trimester: Secondary | ICD-10-CM

## 2023-02-28 DIAGNOSIS — O30042 Twin pregnancy, dichorionic/diamniotic, second trimester: Secondary | ICD-10-CM

## 2023-03-12 ENCOUNTER — Other Ambulatory Visit: Payer: Self-pay | Admitting: Certified Nurse Midwife

## 2023-03-12 ENCOUNTER — Ambulatory Visit: Payer: Medicaid Other | Admitting: Certified Nurse Midwife

## 2023-03-12 ENCOUNTER — Other Ambulatory Visit (HOSPITAL_COMMUNITY)
Admission: RE | Admit: 2023-03-12 | Discharge: 2023-03-12 | Disposition: A | Discharge status: Home / Self Care | Payer: Medicaid Other | Source: Ambulatory Visit | Attending: Certified Nurse Midwife | Admitting: Certified Nurse Midwife

## 2023-03-12 ENCOUNTER — Ambulatory Visit (INDEPENDENT_AMBULATORY_CARE_PROVIDER_SITE_OTHER): Payer: Medicaid Other

## 2023-03-12 VITALS — BP 112/71 | HR 69 | Wt 230.2 lb

## 2023-03-12 DIAGNOSIS — Z202 Contact with and (suspected) exposure to infections with a predominantly sexual mode of transmission: Secondary | ICD-10-CM | POA: Insufficient documentation

## 2023-03-12 DIAGNOSIS — Z3A21 21 weeks gestation of pregnancy: Secondary | ICD-10-CM | POA: Diagnosis not present

## 2023-03-12 DIAGNOSIS — O30042 Twin pregnancy, dichorionic/diamniotic, second trimester: Secondary | ICD-10-CM

## 2023-03-12 DIAGNOSIS — O0992 Supervision of high risk pregnancy, unspecified, second trimester: Secondary | ICD-10-CM

## 2023-03-12 LAB — POCT URINALYSIS DIPSTICK OB
Bilirubin, UA: NEGATIVE
Blood, UA: NEGATIVE
Glucose, UA: NEGATIVE
Ketones, UA: NEGATIVE
Leukocytes, UA: NEGATIVE
Nitrite, UA: NEGATIVE
POC,PROTEIN,UA: NEGATIVE
Spec Grav, UA: 1.025 (ref 1.010–1.025)
Urobilinogen, UA: 0.2 E.U./dL
pH, UA: 5 (ref 5.0–8.0)

## 2023-03-12 MED ORDER — ONDANSETRON HCL 4 MG PO TABS: 4.0000 mg | ORAL_TABLET | Freq: Three times a day (TID) | ORAL | 1 refills | Status: DC | PRN

## 2023-03-12 MED ORDER — PRENATAL VITAMIN 27-0.8 MG PO TABS: 1.0000 | ORAL_TABLET | Freq: Every day | ORAL | 9 refills | Status: AC

## 2023-03-12 NOTE — Progress Notes (Signed)
`  ROB doing well. U/s today for completion of anatomy. Results reviewed. All questions answered. Anatomy now complete for both babies. Test of cure collected. Discussed weight gain, recommend exercise 3-4 times a week, and discussed diet . Encouraged pt to avoid snacks with high calories and carbohydrates. She verbalizes understanding. Pt c/o constipation , reviewed hydration , fiber in diet , and use of colace BID. Pt requesting refill on her PNV, and zofran. Orders placed. Follow up 3 wks for ROB.   Doreene Burke, CNM    Gershon Cull E, RT on 03/12/2023  1:36 PM  ULTRASOUND REPORT   Location: [facility] Date of Service: 03/12/2023    Twin A:   Indications: f/u Anatomic Survey for-Spine  Findings:  Didi intrauterine pregnancy is visualized with FHR at 153 bpm.    Fetal presentation is Breech.  Placenta: fundal. AFI: subjectively normal.   Anatomic survey is now complete; spine seen today. Stomach, Kidneys and Bladder are also seen again today. Gender - female.     Twin B: Indications: f/u Anatomic Survey for-LVOT, 3VV Findings:  DiDi intrauterine pregnancy is visualized with FHR at 161 bpm.    Fetal presentation is Breech.  Placenta: posterior. AFI: subjectively normal.   Anatomic survey is now complete; LVOT, 3VV seen today. Stomach, Kidneys and Bladder are also seen again today. Gender - female.       Impression: 1. [redacted]w[redacted]d Viable DiDi Intrauterine pregnancy by U/S. 2. Anatomic Survey is now complete.

## 2023-04-02 ENCOUNTER — Ambulatory Visit (INDEPENDENT_AMBULATORY_CARE_PROVIDER_SITE_OTHER): Payer: Medicaid Other

## 2023-04-02 VITALS — BP 89/63 | HR 80 | Wt 236.2 lb

## 2023-04-02 DIAGNOSIS — O30049 Twin pregnancy, dichorionic/diamniotic, unspecified trimester: Secondary | ICD-10-CM | POA: Insufficient documentation

## 2023-04-02 DIAGNOSIS — O34219 Maternal care for unspecified type scar from previous cesarean delivery: Secondary | ICD-10-CM

## 2023-04-02 DIAGNOSIS — Z98891 History of uterine scar from previous surgery: Secondary | ICD-10-CM | POA: Insufficient documentation

## 2023-04-02 DIAGNOSIS — Z3480 Encounter for supervision of other normal pregnancy, unspecified trimester: Secondary | ICD-10-CM

## 2023-04-02 DIAGNOSIS — O30042 Twin pregnancy, dichorionic/diamniotic, second trimester: Secondary | ICD-10-CM

## 2023-04-02 DIAGNOSIS — O30043 Twin pregnancy, dichorionic/diamniotic, third trimester: Secondary | ICD-10-CM

## 2023-04-02 DIAGNOSIS — A5901 Trichomonal vulvovaginitis: Secondary | ICD-10-CM

## 2023-04-02 DIAGNOSIS — Z3A24 24 weeks gestation of pregnancy: Secondary | ICD-10-CM

## 2023-04-02 NOTE — Progress Notes (Signed)
    Return Prenatal Note   Assessment/Plan   Plan  32 y.o. G3P1011 at [redacted]w[redacted]d presents for follow-up OB visit. Reviewed prenatal record including previous visit note.  Twin pregnancy, twins dichorionic and diamniotic Growth ultrasound ordered.   History of cesarean delivery Reviewed need to have next appointment with MD for repeat cesarean planning.   Supervision of other normal pregnancy, antepartum - Prepared for 1 hour glucola, third trimester labs, and Tdap at next visit.  - Reviewed red flag warning signs anticipatory guidance for upcoming prenatal care.     Orders Placed This Encounter  Procedures   US OB Follow Up    Standing Status:   Future    Standing Expiration Date:   07/02/2023    Order Specific Question:   Reason for exam:    Answer:   di/di twins, growth ultrasound    Order Specific Question:   Preferred imaging location?    Answer:   [facility]   28 Week RH+Panel    Standing Status:   Future    Standing Expiration Date:   04/01/2024   Return in about 4 weeks (around 04/30/2023) for ROB with MD .   Future Appointments  Date Time Provider Department Center  04/24/2023  2:55 PM Linzie Collin, MD [facility] None    For next visit:  ROB with 1 hour gluocla, third trimester labs, and Tdap     Subjective   32 y.o. [id number] at [redacted]w[redacted]d presents for this follow-up prenatal visit.  Patient has no concerns.  Patient reports: Movement: Present Contractions: Not present  Objective   Flow sheet Vitals: Pulse Rate: 80 BP: (!) 89/63 Fundal Height: 31 cm Fetal Heart Rate (bpm): A: 160, B: 140 Total weight gain: 16 lb 3.2 oz (7.348 kg)  General Appearance  No acute distress, well appearing, and well nourished Pulmonary   Normal work of breathing Neurologic   Alert and oriented to person, place, and time Psychiatric   Mood and affect within normal limits  Lindalou Hose Leobardo Granlund, CNM  09/03/244:24 PM

## 2023-04-02 NOTE — Assessment & Plan Note (Signed)
Growth ultrasound ordered.

## 2023-04-02 NOTE — Assessment & Plan Note (Signed)
 Prepared for 1 hour glucola, third trimester labs, and Tdap at next visit.  Reviewed red flag warning signs anticipatory guidance for upcoming prenatal care.

## 2023-04-02 NOTE — Assessment & Plan Note (Signed)
Reviewed need to have next appointment with MD for repeat cesarean planning.

## 2023-04-05 NOTE — Addendum Note (Signed)
Addended by: Kathlene Cote on: 04/05/2023 02:26 PM   Modules accepted: Orders

## 2023-04-18 DIAGNOSIS — O99210 Obesity complicating pregnancy, unspecified trimester: Secondary | ICD-10-CM | POA: Insufficient documentation

## 2023-04-23 ENCOUNTER — Ambulatory Visit: Payer: Medicaid Other | Admitting: *Deleted

## 2023-04-23 ENCOUNTER — Ambulatory Visit: Payer: Medicaid Other

## 2023-04-23 ENCOUNTER — Ambulatory Visit: Payer: Medicaid Other | Attending: Obstetrics | Admitting: Obstetrics

## 2023-04-23 ENCOUNTER — Other Ambulatory Visit: Payer: Self-pay | Admitting: *Deleted

## 2023-04-23 VITALS — BP 121/70 | HR 80

## 2023-04-23 DIAGNOSIS — Z3480 Encounter for supervision of other normal pregnancy, unspecified trimester: Secondary | ICD-10-CM | POA: Insufficient documentation

## 2023-04-23 DIAGNOSIS — Z363 Encounter for antenatal screening for malformations: Secondary | ICD-10-CM | POA: Insufficient documentation

## 2023-04-23 DIAGNOSIS — O99212 Obesity complicating pregnancy, second trimester: Secondary | ICD-10-CM | POA: Diagnosis not present

## 2023-04-23 DIAGNOSIS — O34219 Maternal care for unspecified type scar from previous cesarean delivery: Secondary | ICD-10-CM | POA: Diagnosis not present

## 2023-04-23 DIAGNOSIS — O30043 Twin pregnancy, dichorionic/diamniotic, third trimester: Secondary | ICD-10-CM | POA: Insufficient documentation

## 2023-04-23 DIAGNOSIS — Z3A27 27 weeks gestation of pregnancy: Secondary | ICD-10-CM | POA: Insufficient documentation

## 2023-04-23 DIAGNOSIS — O99210 Obesity complicating pregnancy, unspecified trimester: Secondary | ICD-10-CM

## 2023-04-23 DIAGNOSIS — O321XX0 Maternal care for breech presentation, not applicable or unspecified: Secondary | ICD-10-CM | POA: Diagnosis not present

## 2023-04-23 DIAGNOSIS — O30042 Twin pregnancy, dichorionic/diamniotic, second trimester: Secondary | ICD-10-CM | POA: Diagnosis not present

## 2023-04-23 DIAGNOSIS — E669 Obesity, unspecified: Secondary | ICD-10-CM | POA: Diagnosis not present

## 2023-04-23 DIAGNOSIS — Z98891 History of uterine scar from previous surgery: Secondary | ICD-10-CM | POA: Insufficient documentation

## 2023-04-23 NOTE — Progress Notes (Signed)
MFM Note  Jessica Davidson was seen due to a spontaneously conceived twin pregnancy and maternal obesity.  She is currently at 27 weeks and 2 days.  She denies any significant past medical history and denies any problems in her current pregnancy.  The patient reports that she had normal fetal anatomy scans performed in your office earlier in her pregnancy.  [name] which indicated a low risk for trisomy 39, 49, and 34. Two female fetuses are predicted.  A thick dividing membrane was noted separating the two fetuses, indicating that these are dichorionic, diamniotic twins.  The fetal growth and amniotic fluid level appeared appropriate for both twin A and twin B.    The views of the fetal anatomy were limited today due to her advanced gestational age and the fetal positions.  The limitations of ultrasound in the detection of all anomalies was discussed today.  The management of dichorionic twins was discussed.  She was advised that management of twin pregnancies will involve frequent ultrasound exams to assess the fetal growth and amniotic fluid level.   We will continue to follow her with monthly growth ultrasounds.    Weekly fetal testing for dichorionic twins should start at around 36 weeks.    Delivery for uncomplicated dichorionic twins should occur at around 38 weeks.  A follow-up exam was scheduled in 4 weeks to assess the fetal growth.   The patient and her partner stated that all of their questions were answered today.  A total of 30 minutes was spent counseling and coordinating the care for this patient.  Greater than 50% of the time was spent in direct face-to-face contact.

## 2023-04-24 ENCOUNTER — Ambulatory Visit (INDEPENDENT_AMBULATORY_CARE_PROVIDER_SITE_OTHER): Payer: Medicaid Other

## 2023-04-24 ENCOUNTER — Other Ambulatory Visit: Payer: Medicaid Other

## 2023-04-24 VITALS — BP 115/71 | HR 83 | Wt 238.0 lb

## 2023-04-24 DIAGNOSIS — O0992 Supervision of high risk pregnancy, unspecified, second trimester: Secondary | ICD-10-CM

## 2023-04-24 DIAGNOSIS — Z3A27 27 weeks gestation of pregnancy: Secondary | ICD-10-CM

## 2023-04-24 DIAGNOSIS — O30042 Twin pregnancy, dichorionic/diamniotic, second trimester: Secondary | ICD-10-CM

## 2023-04-24 DIAGNOSIS — Z23 Encounter for immunization: Secondary | ICD-10-CM

## 2023-04-24 DIAGNOSIS — Z3480 Encounter for supervision of other normal pregnancy, unspecified trimester: Secondary | ICD-10-CM

## 2023-04-24 DIAGNOSIS — O99013 Anemia complicating pregnancy, third trimester: Secondary | ICD-10-CM

## 2023-04-24 DIAGNOSIS — Z98891 History of uterine scar from previous surgery: Secondary | ICD-10-CM

## 2023-04-24 DIAGNOSIS — O0990 Supervision of high risk pregnancy, unspecified, unspecified trimester: Secondary | ICD-10-CM

## 2023-04-24 NOTE — Assessment & Plan Note (Addendum)
-   Normal growth ultrasound with MFM yesterday. Has follow up scheduled in October.  - 1 hour glucola, third trimester labs, and BTFC completed today. Desires Tdap at next visit.  - Flu vaccine declined today. - Reviewed kick counts and preterm labor warning signs. Instructed to call office or come to hospital with persistent headache, vision changes, regular contractions, leaking of fluid, decreased fetal movement or vaginal bleeding.

## 2023-04-24 NOTE — Progress Notes (Signed)
    Return Prenatal Note   Assessment/Plan   Plan  32 y.o. G3P1011 at [redacted]w[redacted]d presents for follow-up OB visit. Reviewed prenatal record including previous visit note.  Supervision of high risk pregnancy, antepartum - Normal growth ultrasound with MFM yesterday. Has follow up scheduled in October.  - 1 hour glucola, third trimester labs, Tdap, and BTFC completed today. - Flu vaccine declined today. - Reviewed kick counts and preterm labor warning signs. Instructed to call office or come to hospital with persistent headache, vision changes, regular contractions, leaking of fluid, decreased fetal movement or vaginal bleeding.   History of cesarean delivery - Emphasized importance of scheduling next appointment with MD for repeat CS planning.    No orders of the defined types were placed in this encounter.  Return in about 2 weeks (around 05/08/2023) for Leotis Pain WITH MD!!!.   Future Appointments  Date Time Provider Department Center  04/24/2023 11:15 AM Deepika Decatur, Lindalou Hose, CNM [facility] None  05/22/2023  1:30 PM [facility] US[facility]    For next visit:   Meet with MD to discuss repeat CS planning     Subjective   32 y.o. [id number] at [redacted]w[redacted]d presents for this follow-up prenatal visit.  Patient has no concerns today. Patient reports: Movement: Present Contractions: Not present  Objective   Flow sheet Vitals: Pulse Rate: 83 BP: 115/71 Fundal Height: 35 cm Fetal Heart Rate (bpm): A:155   B:150 Total weight gain: 18 lb (8.165 kg)  General Appearance  No acute distress, well appearing, and well nourished Pulmonary   Normal work of breathing Neurologic   Alert and oriented to person, place, and time Psychiatric   Mood and affect within normal limits  Lindalou Hose Kylee Umana, CNM  04/23/2409:21 AM

## 2023-04-24 NOTE — Assessment & Plan Note (Signed)
-   Emphasized importance of scheduling next appointment with MD for repeat CS planning.

## 2023-04-25 DIAGNOSIS — O99019 Anemia complicating pregnancy, unspecified trimester: Secondary | ICD-10-CM | POA: Insufficient documentation

## 2023-04-25 LAB — 28 WEEK RH+PANEL
Basophils Absolute: 0 10*3/uL (ref 0.0–0.2)
Basos: 0 %
EOS (ABSOLUTE): 0 10*3/uL (ref 0.0–0.4)
Eos: 1 %
Gestational Diabetes Screen: 114 mg/dL (ref 70–139)
HIV Screen 4th Generation wRfx: NONREACTIVE
Hematocrit: 32.7 % — ABNORMAL LOW (ref 34.0–46.6)
Hemoglobin: 10.9 g/dL — ABNORMAL LOW (ref 11.1–15.9)
Immature Grans (Abs): 0 10*3/uL (ref 0.0–0.1)
Immature Granulocytes: 0 %
Lymphocytes Absolute: 1.4 10*3/uL (ref 0.7–3.1)
Lymphs: 22 %
MCH: 29.9 pg (ref 26.6–33.0)
MCHC: 33.3 g/dL (ref 31.5–35.7)
MCV: 90 fL (ref 79–97)
Monocytes Absolute: 0.3 10*3/uL (ref 0.1–0.9)
Monocytes: 5 %
Neutrophils Absolute: 4.6 10*3/uL (ref 1.4–7.0)
Neutrophils: 72 %
Platelets: 216 10*3/uL (ref 150–450)
RBC: 3.64 x10E6/uL — ABNORMAL LOW (ref 3.77–5.28)
RDW: 13.4 % (ref 11.7–15.4)
RPR Ser Ql: NONREACTIVE
WBC: 6.3 10*3/uL (ref 3.4–10.8)

## 2023-04-25 MED ORDER — FERROUS SULFATE 325 (65 FE) MG PO TABS
325.0000 mg | ORAL_TABLET | ORAL | 2 refills | Status: DC
Start: 2023-04-25 — End: 2023-07-03

## 2023-05-08 ENCOUNTER — Ambulatory Visit: Payer: Medicaid Other | Admitting: Obstetrics and Gynecology

## 2023-05-08 ENCOUNTER — Encounter: Payer: Self-pay | Admitting: Obstetrics and Gynecology

## 2023-05-08 VITALS — BP 116/67 | HR 83 | Wt 245.2 lb

## 2023-05-08 DIAGNOSIS — O0990 Supervision of high risk pregnancy, unspecified, unspecified trimester: Secondary | ICD-10-CM

## 2023-05-08 DIAGNOSIS — O34219 Maternal care for unspecified type scar from previous cesarean delivery: Secondary | ICD-10-CM

## 2023-05-08 DIAGNOSIS — Z01818 Encounter for other preprocedural examination: Secondary | ICD-10-CM

## 2023-05-08 DIAGNOSIS — Z23 Encounter for immunization: Secondary | ICD-10-CM | POA: Diagnosis not present

## 2023-05-08 DIAGNOSIS — Z3483 Encounter for supervision of other normal pregnancy, third trimester: Secondary | ICD-10-CM

## 2023-05-08 DIAGNOSIS — O30043 Twin pregnancy, dichorionic/diamniotic, third trimester: Secondary | ICD-10-CM

## 2023-05-08 DIAGNOSIS — Z3A29 29 weeks gestation of pregnancy: Secondary | ICD-10-CM

## 2023-05-08 LAB — POCT URINALYSIS DIPSTICK OB
Bilirubin, UA: NEGATIVE
Blood, UA: NEGATIVE
Glucose, UA: NEGATIVE
Ketones, UA: NEGATIVE
Leukocytes, UA: NEGATIVE
Nitrite, UA: NEGATIVE
Spec Grav, UA: 1.015 (ref 1.010–1.025)
Urobilinogen, UA: 0.2 U/dL
pH, UA: 6 (ref 5.0–8.0)

## 2023-05-08 MED ORDER — ASPIRIN 81 MG PO TBEC: 162.0000 mg | DELAYED_RELEASE_TABLET | Freq: Every day | ORAL | 3 refills | Status: DC

## 2023-05-08 NOTE — Addendum Note (Signed)
Addended by: Fabian November on: 05/08/2023 05:26 PM   Modules accepted: Orders

## 2023-05-08 NOTE — Progress Notes (Signed)
ROB [redacted]w[redacted]d: She is doing well. She reports good fetal movement. No new concerns today. TDAP done.

## 2023-05-08 NOTE — Progress Notes (Signed)
ROB: Patient is a 32 y.o. G3P1011 at [redacted]w[redacted]d who presents for routine OB care.  Has did/twin pregnancy. Pregnancy problem list includes:   Patient Active Problem List   Diagnosis Date Noted   Anemia affecting pregnancy 04/25/2023   Obesity affecting pregnancy, antepartum 04/18/2023   Twin pregnancy, twins dichorionic and diamniotic 04/02/2023   History of cesarean delivery 04/02/2023   Trichomonal vaginitis during pregnancy in first trimester 01/17/2023   Marijuana use during pregnancy 12/30/2022   Supervision of high risk pregnancy, antepartum 12/05/2022    Patient has no major complaints today. Feeling good fetal movement x 2. Denies LOF, ctx, or vaginal bleeding. Review of labs notes normal diabetes screen, mild anemia of pregnancy. Patient desires refill on her aspirin.  Has next growth scan on 10/23. Due to begin weekly NSTs at 32 weeks. Discussed delivery planning. Has a h/o C-section, desiring repeat. Discussed scheduling today, for SVD would plan for 38 weeks so for C/S will plan for 37 weeks. Reviewed available dates, patient desires 12/2. Will schedule. To f/u at 35-36 weeks for pre-op.  Tdap given today. Declined flu. Plans for OCPs for contraception postpartum.  RTC in 2 weeks.

## 2023-05-22 ENCOUNTER — Ambulatory Visit: Payer: Medicaid Other | Attending: Obstetrics

## 2023-05-22 ENCOUNTER — Ambulatory Visit: Payer: Medicaid Other | Admitting: Obstetrics

## 2023-05-22 ENCOUNTER — Other Ambulatory Visit: Payer: Self-pay

## 2023-05-22 ENCOUNTER — Other Ambulatory Visit: Payer: Self-pay | Admitting: *Deleted

## 2023-05-22 VITALS — BP 111/80 | HR 95 | Wt 247.0 lb

## 2023-05-22 VITALS — BP 134/67

## 2023-05-22 DIAGNOSIS — O30042 Twin pregnancy, dichorionic/diamniotic, second trimester: Secondary | ICD-10-CM | POA: Diagnosis present

## 2023-05-22 DIAGNOSIS — O99013 Anemia complicating pregnancy, third trimester: Secondary | ICD-10-CM | POA: Diagnosis present

## 2023-05-22 DIAGNOSIS — Z3A31 31 weeks gestation of pregnancy: Secondary | ICD-10-CM

## 2023-05-22 DIAGNOSIS — O99213 Obesity complicating pregnancy, third trimester: Secondary | ICD-10-CM

## 2023-05-22 DIAGNOSIS — O30043 Twin pregnancy, dichorionic/diamniotic, third trimester: Secondary | ICD-10-CM | POA: Diagnosis not present

## 2023-05-22 DIAGNOSIS — O0990 Supervision of high risk pregnancy, unspecified, unspecified trimester: Secondary | ICD-10-CM

## 2023-05-22 DIAGNOSIS — E669 Obesity, unspecified: Secondary | ICD-10-CM | POA: Diagnosis not present

## 2023-05-22 DIAGNOSIS — Z98891 History of uterine scar from previous surgery: Secondary | ICD-10-CM | POA: Diagnosis present

## 2023-05-22 DIAGNOSIS — O34219 Maternal care for unspecified type scar from previous cesarean delivery: Secondary | ICD-10-CM | POA: Diagnosis not present

## 2023-05-22 NOTE — Assessment & Plan Note (Signed)
-  Reviewed s/s of PTL and when to go to the hospital -CS scheduled for 07/01/23 -Taking PO iron -Encouraged rest, healthy diet -Discussed antenatal testing

## 2023-05-22 NOTE — Progress Notes (Signed)
    Return Prenatal Note   Assessment/Plan   Plan  32 y.o. G3P1011 at [redacted]w[redacted]d presents for follow-up OB visit. Reviewed prenatal record including previous visit note.  Twin pregnancy, twins dichorionic and diamniotic -MFM growth scan today -Weekly NSTs at 36 weeks per MFM guidelines  Supervision of high risk pregnancy, antepartum -Reviewed s/s of PTL and when to go to the hospital -CS scheduled for 07/01/23 -Taking PO iron -Encouraged rest, healthy diet -Discussed antenatal testing   No orders of the defined types were placed in this encounter.  Return in about 2 weeks (around 06/05/2023).   Future Appointments  Date Time Provider Department Center  05/22/2023  1:30 PM [facility] US[facility]  06/05/2023  3:35 PM [name] M, CNM [facility] None  06/24/2023  1:45 PM [facility] PAT1 [facility] None  07/09/2023  2:35 PM Hildred Laser, MD [facility] None    For next visit:  continue with routine prenatal care     Subjective   Jessica Davidson is tired but doing well. She is ready to meet the babies.  Movement: Present Contractions: Not present  Objective   Flow sheet Vitals: Pulse Rate: 95 BP: 111/80 Fundal Height: 40 cm Fetal Heart Rate (bpm): 140/155 Total weight gain: 27 lb (12.2 kg)  General Appearance  No acute distress, well appearing, and well nourished Pulmonary   Normal work of breathing Neurologic   Alert and oriented to person, place, and time Psychiatric   Mood and affect within normal limits  [name], CNM 05/22/23 10:40 AM

## 2023-05-22 NOTE — Assessment & Plan Note (Signed)
-  MFM growth scan today -Weekly NSTs at 36 weeks per MFM guidelines

## 2023-06-05 ENCOUNTER — Ambulatory Visit (INDEPENDENT_AMBULATORY_CARE_PROVIDER_SITE_OTHER): Payer: Medicaid Other | Admitting: Obstetrics

## 2023-06-05 ENCOUNTER — Encounter: Payer: Self-pay | Admitting: Obstetrics

## 2023-06-05 VITALS — BP 120/75 | HR 86 | Wt 256.0 lb

## 2023-06-05 DIAGNOSIS — O0990 Supervision of high risk pregnancy, unspecified, unspecified trimester: Secondary | ICD-10-CM

## 2023-06-05 DIAGNOSIS — O0993 Supervision of high risk pregnancy, unspecified, third trimester: Secondary | ICD-10-CM

## 2023-06-05 DIAGNOSIS — Z3A33 33 weeks gestation of pregnancy: Secondary | ICD-10-CM

## 2023-06-05 NOTE — Assessment & Plan Note (Signed)
-  Reviewed s/s of PTL and when to go to the hospital -MFM [place] scheduled through the end of pregnancy -Good support systems in place for PP

## 2023-06-05 NOTE — Progress Notes (Signed)
    Return Prenatal Note   Assessment/Plan   Plan  32 y.o. G3P1011 at [redacted]w[redacted]d presents for follow-up OB visit. Reviewed prenatal record including previous visit note.  Supervision of high risk pregnancy, antepartum -Reviewed s/s of PTL and when to go to the hospital -MFM [place] scheduled through the end of pregnancy -Good support systems in place for PP   No orders of the defined types were placed in this encounter.  Return in about 2 weeks (around 06/19/2023).   Future Appointments  Date Time Provider Department Center  06/19/2023  1:15 PM [facility] NURSE [facility]  06/19/2023  1:30 PM [facility]5 [facility]  06/24/2023  1:45 PM [facility] None  06/25/2023  1:15 PM [facility] NURSE [facility]  06/25/2023  1:30 PM [facility] US4 [facility]  07/03/2023  1:15 PM [facility] NURSE [facility]  07/03/2023  1:30 PM [facility] US2 [facility]  07/09/2023  2:35 PM Hildred Laser, MD [facility] None    For next visit:  ROB with GBS screening      Subjective   Jessica Davidson is feeling well - no back pain, getting enough sleep, lots of fetal movement. She is looking forward to meeting her babies. Her mother will stay with the family after the birth.  Movement: Present Contractions: Not present  Objective   Flow sheet Vitals: Pulse Rate: 86 BP: 120/75 Fundal Height: 44 cm Fetal Heart Rate (bpm): 145/155 Total weight gain: 36 lb (16.3 kg)  General Appearance  No acute distress, well appearing, and well nourished Pulmonary   Normal work of breathing Neurologic   Alert and oriented to person, place, and time Psychiatric   Mood and affect within normal limits  [name], CNM 06/05/23 4:05 PM

## 2023-06-14 ENCOUNTER — Ambulatory Visit
Admission: EM | Admit: 2023-06-14 | Discharge: 2023-06-14 | Disposition: A | Discharge status: Home / Self Care | Payer: Medicaid Other | Attending: Family Medicine | Admitting: Family Medicine

## 2023-06-14 DIAGNOSIS — J0100 Acute maxillary sinusitis, unspecified: Secondary | ICD-10-CM

## 2023-06-14 MED ORDER — FLUCONAZOLE 150 MG PO TABS: 150.0000 mg | ORAL_TABLET | Freq: Once | ORAL | 0 refills | Status: AC

## 2023-06-14 MED ORDER — CEFDINIR 300 MG PO CAPS: 300.0000 mg | ORAL_CAPSULE | Freq: Two times a day (BID) | ORAL | 0 refills | Status: DC

## 2023-06-14 NOTE — ED Provider Notes (Signed)
[facility]    CSN: [id number] Arrival date & time: 06/14/23  1307      History   Chief Complaint Chief Complaint  Patient presents with   Otalgia    HPI Jessica Davidson is a 32 y.o. female.   HPI   Jessica Davidson presents for right ear pain and sinus pressure with facial pain for the past 2 weeks.  She has dental pain as well. Took some tylenol sinus which helped in the beginning but stopped working.  No fever, vomiting or sore throat.    She is [redacted] weeks pregnant with twins. Due for C-section on 07/01/23.     Past Medical History:  Diagnosis Date   Irregular contractions 04/08/2016   Medical history non-contributory     Patient Active Problem List   Diagnosis Date Noted   Anemia affecting pregnancy 04/25/2023   Obesity affecting pregnancy, antepartum 04/18/2023   Twin pregnancy, twins dichorionic and diamniotic 04/02/2023   History of cesarean delivery 04/02/2023   Trichomonal vaginitis during pregnancy in first trimester 01/17/2023   Marijuana use during pregnancy 12/30/2022   Supervision of high risk pregnancy, antepartum 12/05/2022    Past Surgical History:  Procedure Laterality Date   CESAREAN SECTION N/A 04/26/2016   Procedure: CESAREAN SECTION;  Surgeon: Vena Austria, MD;  Location: [facility] ORS;  Service: Obstetrics;  Laterality: N/A;   TONSILLECTOMY     WISDOM TOOTH EXTRACTION  2018   four    OB History     Gravida  3   Para  1   Term  1   Preterm  0   AB  1   Living  1      SAB  1   IAB  0   Ectopic  0   Multiple  0   Live Births  1            Home Medications    Prior to Admission medications   Medication Sig Start Date End Date Taking? Authorizing Provider  aspirin EC 81 MG tablet Take 2 tablets (162 mg total) by mouth daily. Swallow whole. 05/08/23  Yes Hildred Laser, MD  cefdinir (OMNICEF) 300 MG capsule Take 1 capsule (300 mg total) by mouth 2 (two) times daily. 06/14/23  Yes Deeric Cruise, DO  ferrous  sulfate 325 (65 FE) MG tablet Take 1 tablet (325 mg total) by mouth every other day. 04/25/23  Yes Free, Sarah J, CNM  fluconazole (DIFLUCAN) 150 MG tablet Take 1 tablet (150 mg total) by mouth once for 1 dose. 06/14/23 06/14/23 Yes Eder Macek, DO  ondansetron (ZOFRAN) 4 MG tablet Take 1 tablet (4 mg total) by mouth every 8 (eight) hours as needed for nausea or vomiting. 03/12/23  Yes Doreene Burke, CNM  Prenatal Vit-Fe Fumarate-FA (PRENATAL VITAMIN) 27-0.8 MG TABS Take 1 tablet by mouth daily at 6 (six) AM. 03/12/23  Yes Doreene Burke, CNM    Family History Family History  Problem Relation Age of Onset   Diabetes Mother    Hypertension Mother    Hypertension Father    Healthy Sister    Healthy Sister    Healthy Sister    Healthy Brother    Cancer Maternal Grandmother 30       colon   Multiple sclerosis Maternal Grandfather    Hypertension Paternal Grandmother    Diabetes Paternal Grandfather    Asthma Neg Hx    Heart disease Neg Hx     Social History Social History   Tobacco  Use   Smoking status: Former    Current packs/day: 0.00    Types: Cigarettes    Quit date: 11/15/2022    Years since quitting: 0.5   Smokeless tobacco: Never  Vaping Use   Vaping status: Never Used  Substance Use Topics   Alcohol use: Not Currently    Alcohol/week: 4.0 standard drinks of alcohol    Types: 4 Shots of liquor per week    Comment: last use 10/29/22- tequila "3 to 4 shots"   Drug use: No     Allergies   Amoxicillin   Review of Systems Review of Systems: :negative unless otherwise stated in HPI.      Physical Exam Triage Vital Signs ED Triage Vitals  Encounter Vitals Group     BP --      Systolic BP Percentile --      Diastolic BP Percentile --      Pulse --      Resp --      Temp --      Temp src --      SpO2 --      Weight 06/14/23 1330 256 lb (116.1 kg)     Height 06/14/23 1330 5\' 9"  (1.753 m)     Head Circumference --      Peak Flow --      Pain Score  06/14/23 1329 9     Pain Loc --      Pain Education --      Exclude from Growth Chart --    No data found.  Updated Vital Signs BP 126/82 (BP Location: Left Arm)   Pulse 85   Temp 98.2 F (36.8 C) (Oral)   Ht 5\' 9"  (1.753 m)   Wt 116.1 kg   LMP 09/05/2022 (Within Days)   SpO2 100%   BMI 37.80 kg/m   Visual Acuity Right Eye Distance:   Left Eye Distance:   Bilateral Distance:    Right Eye Near:   Left Eye Near:    Bilateral Near:     Physical Exam GEN:     alert, non-toxic appearing female in no distress    HENT:  mucus membranes moist, oropharyngeal without lesions or exudate, no tonsillar hypertrophy,  moderate erythematous turbinate hypertrophy, yellow-green nasal discharge, right TM normal, left TM normal, normal external auditory canals bilaterally, nontender tragus, maxillary and frontal sinus tenderness on right EYES:   no scleral injection NECK:  normal ROM, no meningismus   RESP:  no increased work of breathing GU: gravid uterus   Skin:   warm and dry    UC Treatments / Results  Labs (all labs ordered are listed, but only abnormal results are displayed) Labs Reviewed - No data to display  EKG   Radiology No results found.  Procedures Procedures (including critical care time)  Medications Ordered in UC Medications - No data to display  Initial Impression / Assessment and Plan / UC Course  I have reviewed the triage vital signs and the nursing notes.  Pertinent labs & imaging results that were available during my care of the patient were reviewed by me and considered in my medical decision making (see chart for details).     Pt is a 32 y.o. female who presents for sinus congestion and pain for past 2 weeks. Jessica Davidson is afebrile without recent use of antipyretics. Satting well on room air. Overall pt is well appearing, well hydrated, without respiratory distress.   She has evidence of frontomaxillary  sinusitis.  Treat with cefdinir. Rx sent to  pharmacy. Diflucan co-prescribed due to history of yeast infections after antibiotics. Discussed typical duration of symptoms. OTC symptom care as needed. Ensure adequate fluid intake and rest.  Discussed MDM, treatment plan and plan for follow-up with patient who agrees with plan.    Final Clinical Impressions(s) / UC Diagnoses   Final diagnoses:  Acute non-recurrent maxillary sinusitis     Discharge Instructions      Stop by the pharmacy to pick up your prescriptions.  Follow up with your primary care provider as needed.  Best wishes with the upcoming delivery of your twins!      ED Prescriptions     Medication Sig Dispense Auth. Provider   cefdinir (OMNICEF) 300 MG capsule Take 1 capsule (300 mg total) by mouth 2 (two) times daily. 14 capsule Charmagne Buhl, DO   fluconazole (DIFLUCAN) 150 MG tablet Take 1 tablet (150 mg total) by mouth once for 1 dose. 1 tablet Katha Cabal, DO      PDMP not reviewed this encounter.   Katha Cabal, DO 06/14/23 1356

## 2023-06-14 NOTE — ED Triage Notes (Addendum)
Pt c/o right ear pain and throbbing x12days  Pt states that the right side of her head hurts but the pressure is in the right ear.  Pt states that the pain goes down the right side of her neck and brow.   Pt is pregnant and due for birth on 07/01/23 with twins

## 2023-06-14 NOTE — Discharge Instructions (Signed)
Stop by the pharmacy to pick up your prescriptions.  Follow up with your primary care provider as needed.  Best wishes with the upcoming delivery of your twins!

## 2023-06-19 ENCOUNTER — Other Ambulatory Visit: Payer: Self-pay | Admitting: Obstetrics and Gynecology

## 2023-06-19 ENCOUNTER — Ambulatory Visit: Payer: Medicaid Other | Admitting: Obstetrics

## 2023-06-19 ENCOUNTER — Other Ambulatory Visit: Payer: Self-pay

## 2023-06-19 ENCOUNTER — Ambulatory Visit (HOSPITAL_BASED_OUTPATIENT_CLINIC_OR_DEPARTMENT_OTHER): Payer: Medicaid Other

## 2023-06-19 ENCOUNTER — Other Ambulatory Visit (HOSPITAL_COMMUNITY)
Admission: RE | Admit: 2023-06-19 | Discharge: 2023-06-19 | Disposition: A | Discharge status: Home / Self Care | Payer: Medicaid Other | Source: Ambulatory Visit | Attending: Obstetrics | Admitting: Obstetrics

## 2023-06-19 ENCOUNTER — Ambulatory Visit: Payer: Medicaid Other | Admitting: *Deleted

## 2023-06-19 ENCOUNTER — Encounter: Payer: Self-pay | Admitting: Obstetrics

## 2023-06-19 VITALS — BP 112/65 | HR 81

## 2023-06-19 VITALS — BP 120/70 | Wt 263.0 lb

## 2023-06-19 DIAGNOSIS — Z3A35 35 weeks gestation of pregnancy: Secondary | ICD-10-CM

## 2023-06-19 DIAGNOSIS — O99213 Obesity complicating pregnancy, third trimester: Secondary | ICD-10-CM | POA: Insufficient documentation

## 2023-06-19 DIAGNOSIS — O0990 Supervision of high risk pregnancy, unspecified, unspecified trimester: Secondary | ICD-10-CM | POA: Insufficient documentation

## 2023-06-19 DIAGNOSIS — Z98891 History of uterine scar from previous surgery: Secondary | ICD-10-CM | POA: Insufficient documentation

## 2023-06-19 DIAGNOSIS — O30043 Twin pregnancy, dichorionic/diamniotic, third trimester: Secondary | ICD-10-CM | POA: Insufficient documentation

## 2023-06-19 DIAGNOSIS — Z2911 Encounter for prophylactic immunotherapy for respiratory syncytial virus (RSV): Secondary | ICD-10-CM

## 2023-06-19 DIAGNOSIS — Z23 Encounter for immunization: Secondary | ICD-10-CM | POA: Diagnosis not present

## 2023-06-19 DIAGNOSIS — O34219 Maternal care for unspecified type scar from previous cesarean delivery: Secondary | ICD-10-CM

## 2023-06-19 DIAGNOSIS — E669 Obesity, unspecified: Secondary | ICD-10-CM | POA: Diagnosis not present

## 2023-06-19 DIAGNOSIS — O99013 Anemia complicating pregnancy, third trimester: Secondary | ICD-10-CM | POA: Insufficient documentation

## 2023-06-19 DIAGNOSIS — Z3685 Encounter for antenatal screening for Streptococcus B: Secondary | ICD-10-CM

## 2023-06-19 DIAGNOSIS — O99210 Obesity complicating pregnancy, unspecified trimester: Secondary | ICD-10-CM

## 2023-06-19 NOTE — Progress Notes (Signed)
    Return Prenatal Note   Subjective  32 y.o. G3P1011 at [redacted]w[redacted]d presents for this follow-up prenatal visit. Pregnancy notable for di-di twins, maternal BMI, prior CD x1 desiring repeat, and THC use this pregnancy.   Patient is well today, was at MFM earlier today for growth [place] and BPP. States everything went well, has BPPs scheduled for next week with them.   Patient reports:  Movement: Present Contractions: Not present Denies vaginal bleeding or leaking fluid. Objective  Flow sheet Vitals: BP: 120/70 Fundal Height: 48 cm Fetal Heart Rate (bpm): 132/148 Total weight gain: 43 lb (19.5 kg)  General Appearance  No acute distress, well appearing, and well nourished Pulmonary   Normal work of breathing Neurologic   Alert and oriented to person, place, and time Psychiatric   Mood and affect within normal limits  Assessment/Plan   Plan  32 y.o. [id number] at [redacted]w[redacted]d presents for follow-up OB visit. Reviewed prenatal record including previous visit note. 1. Dichorionic diamniotic twin pregnancy in third trimester -Growth [place] [redacted]w[redacted]d: concordant A: cephalic, posterior placenta, MVP 7.4, 2419g (5#5) 21%ile, BPP 8/8 B: transverse, head maternal L, posterior placenta, MVP 6.11, 2612g (5#12) 41%ile, BPP 8/8 -GBS and GCCT swabs obtained today, re-testing for TV as well due to hx of in 1st trimester -RSV vaccine given today -Repeat BPPs next week with MFM -Labor precautions given  2. Obesity affecting pregnancy, antepartum, unspecified obesity type -Continue daily ASA 162  3. History of cesarean delivery -Desires repeat, scheduled 12/2 with Dr. Valentino Saxon   Future Appointments  Date Time Provider Department Center  06/24/2023  1:45 PM [facility] PAT1 [facility] None  06/25/2023 12:15 PM [facility] NURSE [facility]  06/25/2023 12:30 PM [facility] US1 [facility]  06/25/2023  3:35 PM Julieanne Manson, MD [facility] None  07/03/2023  1:15 PM [facility] NURSE [facility]  07/03/2023  1:30 PM [facility] US2  [facility]  07/09/2023  2:35 PM Hildred Laser, MD [facility] None    For next visit: 1 week, continue with routine prenatal care    Julieanne Manson, DO [facility]

## 2023-06-21 LAB — CERVICOVAGINAL ANCILLARY ONLY
Chlamydia: NEGATIVE
Comment: NEGATIVE
Comment: NEGATIVE
Comment: NORMAL
Neisseria Gonorrhea: NEGATIVE
Trichomonas: NEGATIVE

## 2023-06-23 LAB — STREP GP B CULTURE+RFLX: Strep Gp B Culture+Rflx: NEGATIVE

## 2023-06-24 ENCOUNTER — Encounter
Admission: RE | Admit: 2023-06-24 | Discharge: 2023-06-24 | Disposition: A | Discharge status: Home / Self Care | Payer: Medicaid Other | Source: Ambulatory Visit | Attending: Obstetrics and Gynecology | Admitting: Obstetrics and Gynecology

## 2023-06-24 HISTORY — DX: Gastro-esophageal reflux disease without esophagitis: K21.9

## 2023-06-24 HISTORY — DX: Trichomonal vulvovaginitis: A59.01

## 2023-06-24 HISTORY — DX: Twin pregnancy, dichorionic/diamniotic, unspecified trimester: O30.049

## 2023-06-24 HISTORY — DX: Drug use complicating pregnancy, unspecified trimester: O99.320

## 2023-06-24 HISTORY — DX: Obesity complicating pregnancy, unspecified trimester: O99.210

## 2023-06-24 HISTORY — DX: History of uterine scar from previous surgery: Z98.891

## 2023-06-24 HISTORY — DX: Cannabis use, unspecified, uncomplicated: F12.90

## 2023-06-24 HISTORY — DX: Anemia, unspecified: D64.9

## 2023-06-24 NOTE — Pre-Procedure Instructions (Signed)
Pts c-section is on 07-01-23 and labs ordered from [facility] surgeon..T&S, CBC (both have to be done within 72 hours of surgery per order) and RPR. Asked Quentin Mulling NP about this since we are closed here in [place] on 06-27-23 so we can't get pt to come in for her labs within the 72 window like we normally do. I asked NP if I need to get pt to come in for RPR this week before we close and then just get the T&S and CBC done the morning of section. NP states all the labs, including RPR can be done the morning of surgery and no need for pt to come in just for RPR only to be stuck again the day of section

## 2023-06-24 NOTE — Patient Instructions (Signed)
Your procedure is scheduled on:07-01-23 Monday  Arrival Time: 5:30 AM Please call Labor and Delivery if you have any questions [phone].  Arrival: If your arrival time is prior to 6:00 am, please enter through the Emergency Room Entrance and you will be directed to Labor and Delivery. If your arrival time is 6:00 am or later, please enter the Medical Mall and follow the greeter's instructions.  REMEMBER: Instructions that are not followed completely may result in serious medical risk, up to and including death; or upon the discretion of your surgeon and anesthesiologist your surgery may need to be rescheduled.  Do not eat food OR drink any liquids after midnight the night before surgery.  No gum chewing or hard candies.  One week prior to surgery: Stop Anti-inflammatories (NSAIDS) such as Advil, Aleve, Ibuprofen, Motrin, Naproxen, Naprosyn and Aspirin based products such as Excedrin, Goody's Powder, BC Powder  You may however, continue to take Tylenol if needed for pain up until the day of surgery.  Continue taking all of your other prescription medications up until the day of surgery.  Do NOT take any medication the day of surgery  Ask Dr Valentino Saxon at your appointment tomorrow (06-25-23) if or when you need to stop your Aspirin   No Alcohol for 24 hours before or after surgery.  No Smoking including e-cigarettes for 24 hours prior to surgery.  No chewable tobacco products for at least 6 hours prior to surgery.  No nicotine patches on the day of surgery.  Do not use any "recreational" drugs for at least a week prior to your surgery.  Please be advised that the combination of cocaine and anesthesia may have negative outcomes, up to and including death. If you test positive for cocaine, your surgery will be cancelled.  On the morning of surgery brush your teeth with toothpaste and water, you may rinse your mouth with mouthwash if you wish. Do not swallow any toothpaste or  mouthwash.  Use CHG soap as directed on instruction sheet (Avoid Nipple and Private Area)  Do not wear jewelry, make-up, hairpins, clips or nail polish.  For welded (permanent) jewelry: bracelets, anklets, waist bands, etc.  Please have this removed prior to surgery.  If it is not removed, there is a chance that hospital personnel will need to cut it off on the day of surgery.  Do not wear lotions, powders, or perfumes.   Do not shave body hair from the neck down 48 hours before surgery.  Contact lenses, hearing aids and dentures may not be worn into surgery.  Do not bring valuables to the hospital. [facility] is not responsible for any missing/lost belongings or valuables.   Notify your doctor if there is any change in your medical condition (cold, fever, infection).  Wear comfortable clothing (specific to your surgery type) to the hospital.  After surgery, you can help prevent lung complications by doing breathing exercises.  Take deep breaths and cough every 1-2 hours. Your doctor may order a device called an Incentive Spirometer to help you take deep breaths. When coughing or sneezing, hold a pillow firmly against your incision with both hands. This is called "splinting." Doing this helps protect your incision. It also decreases belly discomfort.  Please call the Pre-admissions Testing Dept. at [phone] if you have any questions about these instructions.  Surgery Visitation Policy:  Visitor Passes   All visitors, including children, need an identification sticker when visiting. These stickers must be worn where they can be  seen.   Labor & Delivery  Laboring women may have one designated support person and two other visitors of any age visit. The support person must remain the same. The visitors may switch with other visitors. Visitation is permitted 24 hours per day. The designated support person or a visitor over the age of 16 may sleep overnight in the patient's  room. A doula registered with [place] for labor and delivery support is not considered a visitor. Doulas not registered with [place] are considered visitors.  Mother Baby Unit, OB Specialty and Gynecological Care  A designated support person and three visitors of any age may visit. The three visitors may switch out. The designated support person or a visitor age 71 or older may stay overnight in the room. During the postpartum period (up to 6 weeks), if the mother is the patient, she can have her newborn stay with her if there is another support person present who can be responsible for the baby.       Preparing for Surgery with CHLORHEXIDINE GLUCONATE (CHG) Soap  Chlorhexidine Gluconate (CHG) Soap  o An antiseptic cleaner that kills germs and bonds with the skin to continue killing germs even after washing  o Used for showering the night before surgery and morning of surgery  Before surgery, you can play an important role by reducing the number of germs on your skin.  CHG (Chlorhexidine gluconate) soap is an antiseptic cleanser which kills germs and bonds with the skin to continue killing germs even after washing.  Please do not use if you have an allergy to CHG or antibacterial soaps. If your skin becomes reddened/irritated stop using the CHG.  1. Shower the NIGHT BEFORE SURGERY and the MORNING OF SURGERY with CHG soap.  2. If you choose to wash your hair, wash your hair first as usual with your normal shampoo.  3. After shampooing, rinse your hair and body thoroughly to remove the shampoo.  4. Use CHG as you would any other liquid soap. You can apply CHG directly to the skin and wash gently with a scrungie or a clean washcloth.  5. Apply the CHG soap to your body only from the neck down. Do not use on open wounds or open sores. Avoid contact with your eyes, ears, mouth, and genitals (private parts). Wash face and genitals (private parts) with your normal soap.  6. Wash  thoroughly, paying special attention to the area where your surgery will be performed.  7. Thoroughly rinse your body with warm water.  8. Do not shower/wash with your normal soap after using and rinsing off the CHG soap.  9. Pat yourself dry with a clean towel.  10. Wear clean pajamas to bed the night before surgery.  12. Place clean sheets on your bed the night of your first shower and do not sleep with pets.  13. Shower again with the CHG soap on the day of surgery prior to arriving at the hospital.  14. Do not apply any deodorants/lotions/powders.  15. Please wear clean clothes to the hospital.  How to Use an Incentive Spirometer An incentive spirometer is a tool that measures how well you are filling your lungs with each breath. Learning to take long, deep breaths using this tool can help you keep your lungs clear and active. This may help to reverse or lessen your chance of developing breathing (pulmonary) problems, especially infection. You may be asked to use a spirometer: After a surgery. If you have  a lung problem or a history of smoking. After a long period of time when you have been unable to move or be active. If the spirometer includes an indicator to show the highest number that you have reached, your health care provider or respiratory therapist will help you set a goal. Keep a log of your progress as told by your health care provider. What are the risks? Breathing too quickly may cause dizziness or cause you to pass out. Take your time so you do not get dizzy or light-headed. If you are in pain, you may need to take pain medicine before doing incentive spirometry. It is harder to take a deep breath if you are having pain. How to use your incentive spirometer  Sit up on the edge of your bed or on a chair. Hold the incentive spirometer so that it is in an upright position. Before you use the spirometer, breathe out normally. Place the mouthpiece in your mouth. Make sure  your lips are closed tightly around it. Breathe in slowly and as deeply as you can through your mouth, causing the piston or the ball to rise toward the top of the chamber. Hold your breath for 3-5 seconds, or for as long as possible. If the spirometer includes a coach indicator, use this to guide you in breathing. Slow down your breathing if the indicator goes above the marked areas. Remove the mouthpiece from your mouth and breathe out normally. The piston or ball will return to the bottom of the chamber. Rest for a few seconds, then repeat the steps 10 or more times. Take your time and take a few normal breaths between deep breaths so that you do not get dizzy or light-headed. Do this every 1-2 hours when you are awake. If the spirometer includes a goal marker to show the highest number you have reached (best effort), use this as a goal to work toward during each repetition. After each set of 10 deep breaths, cough a few times. This will help to make sure that your lungs are clear. If you have an incision on your chest or abdomen from surgery, place a pillow or a rolled-up towel firmly against the incision when you cough. This can help to reduce pain while taking deep breaths and coughing. General tips When you are able to get out of bed: Walk around often. Continue to take deep breaths and cough in order to clear your lungs. Keep using the incentive spirometer until your health care provider says it is okay to stop using it. If you have been in the hospital, you may be told to keep using the spirometer at home. Contact a health care provider if: You are having difficulty using the spirometer. You have trouble using the spirometer as often as instructed. Your pain medicine is not giving enough relief for you to use the spirometer as told. You have a fever. Get help right away if: You develop shortness of breath. You develop a cough with bloody mucus from the lungs. You have fluid or blood  coming from an incision site after you cough. Summary An incentive spirometer is a tool that can help you learn to take long, deep breaths to keep your lungs clear and active. You may be asked to use a spirometer after a surgery, if you have a lung problem or a history of smoking, or if you have been inactive for a long period of time. Use your incentive spirometer as instructed every 1-2 hours  while you are awake. If you have an incision on your chest or abdomen, place a pillow or a rolled-up towel firmly against your incision when you cough. This will help to reduce pain. Get help right away if you have shortness of breath, you cough up bloody mucus, or blood comes from your incision when you cough. This information is not intended to replace advice given to you by your health care provider. Make sure you discuss any questions you have with your health care provider. Document Revised: 10/05/2019 Document Reviewed: 10/05/2019 Elsevier Patient Education  2024 [employer].

## 2023-06-25 ENCOUNTER — Ambulatory Visit: Payer: Medicaid Other | Attending: Obstetrics and Gynecology

## 2023-06-25 ENCOUNTER — Ambulatory Visit: Payer: Medicaid Other | Admitting: Obstetrics

## 2023-06-25 ENCOUNTER — Other Ambulatory Visit: Payer: Self-pay

## 2023-06-25 ENCOUNTER — Encounter: Payer: Self-pay | Admitting: Obstetrics

## 2023-06-25 ENCOUNTER — Ambulatory Visit: Payer: Medicaid Other | Admitting: *Deleted

## 2023-06-25 VITALS — BP 119/71 | HR 83

## 2023-06-25 VITALS — BP 108/70 | HR 88 | Wt 263.0 lb

## 2023-06-25 DIAGNOSIS — O99013 Anemia complicating pregnancy, third trimester: Secondary | ICD-10-CM | POA: Insufficient documentation

## 2023-06-25 DIAGNOSIS — O30043 Twin pregnancy, dichorionic/diamniotic, third trimester: Secondary | ICD-10-CM

## 2023-06-25 DIAGNOSIS — O0990 Supervision of high risk pregnancy, unspecified, unspecified trimester: Secondary | ICD-10-CM

## 2023-06-25 DIAGNOSIS — Z3A36 36 weeks gestation of pregnancy: Secondary | ICD-10-CM

## 2023-06-25 DIAGNOSIS — Z98891 History of uterine scar from previous surgery: Secondary | ICD-10-CM

## 2023-06-25 DIAGNOSIS — O99213 Obesity complicating pregnancy, third trimester: Secondary | ICD-10-CM

## 2023-06-25 DIAGNOSIS — O34219 Maternal care for unspecified type scar from previous cesarean delivery: Secondary | ICD-10-CM

## 2023-06-25 DIAGNOSIS — E669 Obesity, unspecified: Secondary | ICD-10-CM

## 2023-06-25 DIAGNOSIS — O99210 Obesity complicating pregnancy, unspecified trimester: Secondary | ICD-10-CM

## 2023-06-25 NOTE — Progress Notes (Signed)
    Return Prenatal Note   Subjective  32 y.o. G3P1011 at [redacted]w[redacted]d presents for this follow-up prenatal visit. Pregnancy notable for di-di twins, maternal BMI, prior CD x1 desiring repeat, and THC use this pregnancy.   Patient here with her mother, is well today. Has a sore spot on her lower abdomen that developed over the past week.   Patient reports:  Movement: Present Contractions: Not present Denies vaginal bleeding or leaking fluid. Objective  Flow sheet Vitals: Pulse Rate: 88 BP: 108/70 Fundal Height: 50 cm Fetal Heart Rate (bpm): 155/138 Total weight gain: 43 lb (19.5 kg)  General Appearance  No acute distress, well appearing, and well nourished Pulmonary   Normal work of breathing Neurologic   Alert and oriented to person, place, and time Psychiatric   Mood and affect within normal limits  Assessment/Plan   Plan  32 y.o. [id number] at [redacted]w[redacted]d presents for follow-up OB visit. Reviewed prenatal record including previous visit note. 1. Dichorionic diamniotic twin pregnancy in third trimester -Last growth [place] [redacted]w[redacted]d: concordant A: cephalic, posterior placenta, MVP 7.4, 2419g (5#5) 21%ile, BPP 8/8 B: transverse, head maternal L, posterior placenta, MVP 6.11, 2612g (5#12) 41%ile, BPP 8/8 -Labor precautions given -Has some edema on her lower abdominal pannus region, not cellulitic, but tender. Can try to alleviate pressure on that region with a pregnancy belly band.   2. Obesity affecting pregnancy, antepartum, unspecified obesity type -Continue daily ASA 162  3. History of cesarean delivery -Desires repeat, scheduled 12/2 with Dr. Valentino Saxon  Future Appointments  Date Time Provider Department Center  07/09/2023  2:35 PM Hildred Laser, MD [facility] None     Julieanne Manson, DO [facility]

## 2023-06-28 ENCOUNTER — Other Ambulatory Visit: Payer: Self-pay

## 2023-06-28 ENCOUNTER — Observation Stay
Admission: EM | Admit: 2023-06-28 | Discharge: 2023-06-28 | Disposition: A | Discharge status: Home / Self Care | Payer: Medicaid Other | Attending: Obstetrics | Admitting: Obstetrics

## 2023-06-28 ENCOUNTER — Encounter: Payer: Self-pay | Admitting: Obstetrics

## 2023-06-28 DIAGNOSIS — O99013 Anemia complicating pregnancy, third trimester: Secondary | ICD-10-CM

## 2023-06-28 DIAGNOSIS — Z98891 History of uterine scar from previous surgery: Secondary | ICD-10-CM

## 2023-06-28 DIAGNOSIS — O30043 Twin pregnancy, dichorionic/diamniotic, third trimester: Secondary | ICD-10-CM

## 2023-06-28 DIAGNOSIS — M549 Dorsalgia, unspecified: Secondary | ICD-10-CM | POA: Diagnosis not present

## 2023-06-28 DIAGNOSIS — O26893 Other specified pregnancy related conditions, third trimester: Secondary | ICD-10-CM | POA: Diagnosis not present

## 2023-06-28 DIAGNOSIS — O0990 Supervision of high risk pregnancy, unspecified, unspecified trimester: Principal | ICD-10-CM

## 2023-06-28 DIAGNOSIS — R102 Pelvic and perineal pain: Secondary | ICD-10-CM

## 2023-06-28 DIAGNOSIS — Z3A36 36 weeks gestation of pregnancy: Secondary | ICD-10-CM | POA: Diagnosis not present

## 2023-06-28 DIAGNOSIS — O4193X0 Disorder of amniotic fluid and membranes, unspecified, third trimester, not applicable or unspecified: Principal | ICD-10-CM | POA: Insufficient documentation

## 2023-06-28 LAB — URINALYSIS, ROUTINE W REFLEX MICROSCOPIC
Bilirubin Urine: NEGATIVE
Glucose, UA: NEGATIVE mg/dL
Hgb urine dipstick: NEGATIVE
Ketones, ur: 80 mg/dL — AB
Nitrite: NEGATIVE
Protein, ur: NEGATIVE mg/dL
Specific Gravity, Urine: 1.02 (ref 1.005–1.030)
pH: 5 (ref 5.0–8.0)

## 2023-06-28 LAB — WET PREP, GENITAL
Clue Cells Wet Prep HPF POC: NONE SEEN
Sperm: NONE SEEN
Trich, Wet Prep: NONE SEEN
WBC, Wet Prep HPF POC: <10 (ref <10)
Yeast Wet Prep HPF POC: NONE SEEN

## 2023-06-28 LAB — CBC
HCT: 33.8 % — ABNORMAL LOW (ref 36.0–46.0)
Hemoglobin: 11.5 g/dL — ABNORMAL LOW (ref 12.0–15.0)
MCH: 29.9 pg (ref 26.0–34.0)
MCHC: 34 g/dL (ref 30.0–36.0)
MCV: 87.8 fL (ref 80.0–100.0)
Platelets: 228 10*3/uL (ref 150–400)
RBC: 3.85 MIL/uL — ABNORMAL LOW (ref 3.87–5.11)
RDW: 14.3 % (ref 11.5–15.5)
WBC: 8.4 10*3/uL (ref 4.0–10.5)
nRBC: 0 % (ref 0.0–0.2)

## 2023-06-28 LAB — RUPTURE OF MEMBRANE (ROM)PLUS: Rom Plus: NEGATIVE

## 2023-06-28 LAB — TYPE AND SCREEN
ABO/RH(D): O POS
Antibody Screen: NEGATIVE

## 2023-06-28 LAB — ABO/RH

## 2023-06-28 MED ORDER — SOD CITRATE-CITRIC ACID 500-334 MG/5ML PO SOLN: 30.0000 mL | ORAL | Status: DC | PRN

## 2023-06-28 MED ORDER — ONDANSETRON HCL 4 MG/2ML IJ SOLN: 4.0000 mg | Freq: Four times a day (QID) | INTRAMUSCULAR | Status: DC | PRN

## 2023-06-28 MED ORDER — ACETAMINOPHEN 325 MG PO TABS: 650.0000 mg | ORAL_TABLET | ORAL | Status: DC | PRN

## 2023-06-28 MED ORDER — LACTATED RINGERS IV SOLN: 500.0000 mL | INTRAVENOUS | Status: DC | PRN

## 2023-06-28 NOTE — OB Triage Note (Signed)
Patient is a 32 yo, G3P1, at 36 weeks 5 days, pregnant with didi twins. Patient presents with complaints of leaking of fluid. Patient states she initially felt a gush around 1400 today. Patient states she was getting up and noticed a gush of clear fluid.  Patient denies any vaginal bleeding. Patient denies intercourse within the last 24 hours. Patient reports FM has been decreased today. Monitors applied and assessing. FM palpated upon application of monitors. VSS. Initial fetal heart tone A 145 and B 160. Swanson CNM in dept and notified of patients arrival to unit. Plan to place in observation for ROM+, wet prep, UA, and NST.

## 2023-06-28 NOTE — Progress Notes (Signed)
Discharge instructions provided to patient. Patient verbalized understanding. Pt educated on signs and symptoms of labor, vaginal bleeding, LOF, and fetal movement. Red flag signs reviewed by RN. Patient discharged home with significant other in stable condition.  

## 2023-06-28 NOTE — OB Triage Note (Signed)
LABOR & DELIVERY OB TRIAGE NOTE  SUBJECTIVE  HPI Jessica Davidson is a 32 y.o. G3P1011 at [redacted]w[redacted]d who presents to Labor & Delivery for LOF. She reports that this morning, she felt the urge to void and then had a loss of fluid. She has not continued to leak fluid. She denies ctx but does feel some intermittent pelvic/back pain. She still feels fetal movement, but it has been decreased over the past few days.  OB History     Gravida  3   Para  1   Term  1   Preterm  0   AB  1   Living  1      SAB  1   IAB  0   Ectopic  0   Multiple  0   Live Births  1           Scheduled Meds: Continuous Infusions:  lactated ringers     PRN Meds:.acetaminophen, lactated ringers, ondansetron, sodium citrate-citric acid  OBJECTIVE  BP 109/73 (BP Location: Right Arm)   Pulse 95   Temp 98.2 F (36.8 C) (Oral)   Resp 16   Ht 5\' 9"  (1.753 m)   Wt 119.3 kg   LMP 09/05/2022 (Within Days)   BMI 38.84 kg/m   General: alert, cooperative, NAD Abdomen: soft, gravid, non-tender Cervical exam: deferred  Labs: ROM Plus negative Wet prep negative  NST I reviewed the NST and it was reactive.  Baby A Baseline: 135 Variability: moderate Accelerations: present Decelerations:none Category 1  Baby B Baseline: 145 Variability: moderate Accelerations: Decelerations:none Category 1  Toco: no contractions  ASSESSMENT Impression  1) Pregnancy at G3P1011, [redacted]w[redacted]d, Estimated Date of Delivery: 07/21/23 2) Reassuring maternal/fetal status 3) Membranes intact  PLAN  1) Discharge home with labor/return precautions 2) CS scheduled for 07/01/23  3) Encouraged rest, hydration    [name], CNM 06/28/23  4:35 PM

## 2023-06-29 LAB — RPR: RPR Ser Ql: NONREACTIVE

## 2023-07-01 ENCOUNTER — Inpatient Hospital Stay: Payer: Medicaid Other | Admitting: Registered Nurse

## 2023-07-01 ENCOUNTER — Telehealth: Payer: Self-pay

## 2023-07-01 ENCOUNTER — Other Ambulatory Visit: Payer: Self-pay

## 2023-07-01 ENCOUNTER — Inpatient Hospital Stay: Payer: Medicaid Other | Admitting: Urgent Care

## 2023-07-01 ENCOUNTER — Encounter: Payer: Self-pay | Admitting: Obstetrics and Gynecology

## 2023-07-01 ENCOUNTER — Encounter
Admission: RE | Disposition: A | Discharge status: Home / Self Care | Payer: Self-pay | Source: Home / Self Care | Attending: Obstetrics and Gynecology

## 2023-07-01 ENCOUNTER — Inpatient Hospital Stay
Admission: RE | Admit: 2023-07-01 | Discharge: 2023-07-03 | DRG: 788 | Disposition: A | Discharge status: Home / Self Care | Payer: Medicaid Other | Attending: Obstetrics and Gynecology | Admitting: Obstetrics and Gynecology

## 2023-07-01 DIAGNOSIS — O34211 Maternal care for low transverse scar from previous cesarean delivery: Secondary | ICD-10-CM | POA: Diagnosis present

## 2023-07-01 DIAGNOSIS — Z8249 Family history of ischemic heart disease and other diseases of the circulatory system: Secondary | ICD-10-CM

## 2023-07-01 DIAGNOSIS — Z820 Family history of epilepsy and other diseases of the nervous system: Secondary | ICD-10-CM | POA: Diagnosis not present

## 2023-07-01 DIAGNOSIS — O9902 Anemia complicating childbirth: Secondary | ICD-10-CM | POA: Diagnosis present

## 2023-07-01 DIAGNOSIS — E669 Obesity, unspecified: Secondary | ICD-10-CM | POA: Diagnosis not present

## 2023-07-01 DIAGNOSIS — O99320 Drug use complicating pregnancy, unspecified trimester: Secondary | ICD-10-CM | POA: Diagnosis present

## 2023-07-01 DIAGNOSIS — Z87891 Personal history of nicotine dependence: Secondary | ICD-10-CM

## 2023-07-01 DIAGNOSIS — K219 Gastro-esophageal reflux disease without esophagitis: Secondary | ICD-10-CM | POA: Diagnosis present

## 2023-07-01 DIAGNOSIS — D509 Iron deficiency anemia, unspecified: Secondary | ICD-10-CM | POA: Diagnosis present

## 2023-07-01 DIAGNOSIS — O99214 Obesity complicating childbirth: Secondary | ICD-10-CM | POA: Diagnosis present

## 2023-07-01 DIAGNOSIS — O0990 Supervision of high risk pregnancy, unspecified, unspecified trimester: Principal | ICD-10-CM

## 2023-07-01 DIAGNOSIS — O30043 Twin pregnancy, dichorionic/diamniotic, third trimester: Secondary | ICD-10-CM

## 2023-07-01 DIAGNOSIS — O30049 Twin pregnancy, dichorionic/diamniotic, unspecified trimester: Secondary | ICD-10-CM | POA: Diagnosis present

## 2023-07-01 DIAGNOSIS — O99210 Obesity complicating pregnancy, unspecified trimester: Secondary | ICD-10-CM | POA: Diagnosis present

## 2023-07-01 DIAGNOSIS — O321XX2 Maternal care for breech presentation, fetus 2: Secondary | ICD-10-CM | POA: Diagnosis present

## 2023-07-01 DIAGNOSIS — Z833 Family history of diabetes mellitus: Secondary | ICD-10-CM | POA: Diagnosis not present

## 2023-07-01 DIAGNOSIS — F1290 Cannabis use, unspecified, uncomplicated: Secondary | ICD-10-CM | POA: Diagnosis present

## 2023-07-01 DIAGNOSIS — O99019 Anemia complicating pregnancy, unspecified trimester: Secondary | ICD-10-CM | POA: Diagnosis present

## 2023-07-01 DIAGNOSIS — Z7982 Long term (current) use of aspirin: Secondary | ICD-10-CM | POA: Diagnosis not present

## 2023-07-01 DIAGNOSIS — Z3A37 37 weeks gestation of pregnancy: Secondary | ICD-10-CM

## 2023-07-01 DIAGNOSIS — O9962 Diseases of the digestive system complicating childbirth: Secondary | ICD-10-CM | POA: Diagnosis present

## 2023-07-01 DIAGNOSIS — O99013 Anemia complicating pregnancy, third trimester: Secondary | ICD-10-CM

## 2023-07-01 DIAGNOSIS — Z98891 History of uterine scar from previous surgery: Secondary | ICD-10-CM

## 2023-07-01 DIAGNOSIS — Z01818 Encounter for other preprocedural examination: Secondary | ICD-10-CM

## 2023-07-01 LAB — CBC
HCT: 32.8 % — ABNORMAL LOW (ref 36.0–46.0)
Hemoglobin: 11.2 g/dL — ABNORMAL LOW (ref 12.0–15.0)
MCH: 29.2 pg (ref 26.0–34.0)
MCHC: 34.1 g/dL (ref 30.0–36.0)
MCV: 85.4 fL (ref 80.0–100.0)
Platelets: 229 10*3/uL (ref 150–400)
RBC: 3.84 MIL/uL — ABNORMAL LOW (ref 3.87–5.11)
RDW: 14.1 % (ref 11.5–15.5)
WBC: 6.8 10*3/uL (ref 4.0–10.5)
nRBC: 0 % (ref 0.0–0.2)

## 2023-07-01 LAB — URINE DRUG SCREEN, QUALITATIVE (ARMC ONLY)
Amphetamines, Ur Screen: NOT DETECTED
Barbiturates, Ur Screen: NOT DETECTED
Benzodiazepine, Ur Scrn: NOT DETECTED
Cannabinoid 50 Ng, Ur ~~LOC~~: NOT DETECTED
Cocaine Metabolite,Ur ~~LOC~~: NOT DETECTED
MDMA (Ecstasy)Ur Screen: NOT DETECTED
Methadone Scn, Ur: NOT DETECTED
Opiate, Ur Screen: NOT DETECTED
Phencyclidine (PCP) Ur S: NOT DETECTED
Tricyclic, Ur Screen: NOT DETECTED

## 2023-07-01 LAB — TYPE AND SCREEN
ABO/RH(D): O POS
Antibody Screen: NEGATIVE

## 2023-07-01 LAB — RPR: RPR Ser Ql: NONREACTIVE

## 2023-07-01 SURGERY — Surgical Case
Anesthesia: Spinal

## 2023-07-01 MED ORDER — PHENYLEPHRINE 80 MCG/ML (10ML) SYRINGE FOR IV PUSH (FOR BLOOD PRESSURE SUPPORT)
PREFILLED_SYRINGE | INTRAVENOUS | Status: DC | PRN
  Administered 2023-07-01: 80 ug via INTRAVENOUS
  Administered 2023-07-01: 160 ug via INTRAVENOUS

## 2023-07-01 MED ORDER — MORPHINE SULFATE (PF) 0.5 MG/ML IJ SOLN
INTRAMUSCULAR | Status: DC | PRN
  Administered 2023-07-01: .1 mg via INTRATHECAL

## 2023-07-01 MED ORDER — NALOXONE HCL 4 MG/10ML IJ SOLN: 1.0000 ug/kg/h | INTRAVENOUS | Status: DC | PRN

## 2023-07-01 MED ORDER — OXYCODONE HCL 5 MG PO TABS: 5.0000 mg | ORAL_TABLET | ORAL | Status: DC | PRN

## 2023-07-01 MED ORDER — ONDANSETRON HCL 4 MG/2ML IJ SOLN
INTRAMUSCULAR | Status: AC
Start: 2023-07-01 — End: ?
  Filled 2023-07-01: qty 2

## 2023-07-01 MED ORDER — DIBUCAINE (PERIANAL) 1 % EX OINT: 1.0000 | TOPICAL_OINTMENT | CUTANEOUS | Status: DC | PRN

## 2023-07-01 MED ORDER — BUPIVACAINE IN DEXTROSE 0.75-8.25 % IT SOLN
INTRATHECAL | Status: DC | PRN
  Administered 2023-07-01: 1.5 mL via INTRATHECAL

## 2023-07-01 MED ORDER — ONDANSETRON HCL 4 MG/2ML IJ SOLN
INTRAMUSCULAR | Status: DC | PRN
  Administered 2023-07-01: 4 mg via INTRAVENOUS

## 2023-07-01 MED ORDER — PHENYLEPHRINE HCL (PRESSORS) 10 MG/ML IV SOLN
INTRAVENOUS | Status: AC
  Filled 2023-07-01: qty 1

## 2023-07-01 MED ORDER — DEXAMETHASONE SODIUM PHOSPHATE 10 MG/ML IJ SOLN
INTRAMUSCULAR | Status: DC | PRN
Start: 2023-07-01 — End: 2023-07-01
  Administered 2023-07-01: 10 mg via INTRAVENOUS

## 2023-07-01 MED ORDER — IBUPROFEN 600 MG PO TABS: 600.0000 mg | ORAL_TABLET | Freq: Four times a day (QID) | ORAL | Status: DC

## 2023-07-01 MED ORDER — FENTANYL CITRATE (PF) 100 MCG/2ML IJ SOLN
INTRAMUSCULAR | Status: DC | PRN
  Administered 2023-07-01: 15 ug via INTRATHECAL

## 2023-07-01 MED ORDER — LACTATED RINGERS IV SOLN: INTRAVENOUS | Status: DC

## 2023-07-01 MED ORDER — LACTATED RINGERS IV SOLN
Freq: Once | INTRAVENOUS | Status: AC
Start: 2023-07-01 — End: 2023-07-01

## 2023-07-01 MED ORDER — ZOLPIDEM TARTRATE 5 MG PO TABS: 5.0000 mg | ORAL_TABLET | Freq: Every evening | ORAL | Status: DC | PRN

## 2023-07-01 MED ORDER — DIPHENHYDRAMINE HCL 25 MG PO CAPS: 25.0000 mg | ORAL_CAPSULE | Freq: Four times a day (QID) | ORAL | Status: DC | PRN

## 2023-07-01 MED ORDER — EPHEDRINE SULFATE-NACL 50-0.9 MG/10ML-% IV SOSY
PREFILLED_SYRINGE | INTRAVENOUS | Status: DC | PRN
  Administered 2023-07-01: 5 mg via INTRAVENOUS

## 2023-07-01 MED ORDER — OXYCODONE HCL 5 MG PO TABS
5.0000 mg | ORAL_TABLET | ORAL | Status: DC | PRN
Start: 2023-07-01 — End: 2023-07-03
  Administered 2023-07-02: 5 mg via ORAL
  Filled 2023-07-01: qty 1

## 2023-07-01 MED ORDER — ACETAMINOPHEN 500 MG PO TABS
1000.0000 mg | ORAL_TABLET | ORAL | Status: AC
  Administered 2023-07-01: 1000 mg via ORAL
  Filled 2023-07-01: qty 2

## 2023-07-01 MED ORDER — MEPERIDINE HCL 25 MG/ML IJ SOLN: 6.2500 mg | INTRAMUSCULAR | Status: DC | PRN

## 2023-07-01 MED ORDER — OXYTOCIN-SODIUM CHLORIDE 30-0.9 UT/500ML-% IV SOLN
INTRAVENOUS | Status: AC
  Administered 2023-07-01: 2.5 [IU]/h via INTRAVENOUS
  Filled 2023-07-01: qty 500

## 2023-07-01 MED ORDER — OXYTOCIN-SODIUM CHLORIDE 30-0.9 UT/500ML-% IV SOLN
INTRAVENOUS | Status: AC
  Filled 2023-07-01: qty 500

## 2023-07-01 MED ORDER — SCOPOLAMINE 1 MG/3DAYS TD PT72: 1.0000 | MEDICATED_PATCH | Freq: Once | TRANSDERMAL | Status: DC

## 2023-07-01 MED ORDER — KETOROLAC TROMETHAMINE 30 MG/ML IJ SOLN
30.0000 mg | Freq: Four times a day (QID) | INTRAMUSCULAR | Status: DC
  Administered 2023-07-01 – 2023-07-02 (×2): 30 mg via INTRAVENOUS
  Filled 2023-07-01 (×2): qty 1

## 2023-07-01 MED ORDER — DIPHENHYDRAMINE HCL 50 MG/ML IJ SOLN: 12.5000 mg | INTRAMUSCULAR | Status: DC | PRN

## 2023-07-01 MED ORDER — MORPHINE SULFATE (PF) 0.5 MG/ML IJ SOLN
INTRAMUSCULAR | Status: AC
  Filled 2023-07-01: qty 10

## 2023-07-01 MED ORDER — LIDOCAINE 5 % EX PTCH
1.0000 | MEDICATED_PATCH | CUTANEOUS | Status: DC
  Filled 2023-07-01: qty 1

## 2023-07-01 MED ORDER — SIMETHICONE 80 MG PO CHEW
80.0000 mg | CHEWABLE_TABLET | Freq: Three times a day (TID) | ORAL | Status: DC
  Administered 2023-07-02 – 2023-07-03 (×5): 80 mg via ORAL
  Filled 2023-07-01 (×6): qty 1

## 2023-07-01 MED ORDER — SODIUM CHLORIDE 0.9% FLUSH
3.0000 mL | INTRAVENOUS | Status: DC | PRN
Start: 2023-07-01 — End: 2023-07-02

## 2023-07-01 MED ORDER — COCONUT OIL OIL: 1.0000 | TOPICAL_OIL | Status: DC | PRN

## 2023-07-01 MED ORDER — GABAPENTIN 300 MG PO CAPS
300.0000 mg | ORAL_CAPSULE | ORAL | Status: AC
  Administered 2023-07-01: 300 mg via ORAL
  Filled 2023-07-01: qty 1

## 2023-07-01 MED ORDER — LACTATED RINGERS IV SOLN: Freq: Once | INTRAVENOUS | Status: AC

## 2023-07-01 MED ORDER — CEFAZOLIN SODIUM-DEXTROSE 2-4 GM/100ML-% IV SOLN
2.0000 g | INTRAVENOUS | Status: AC
  Administered 2023-07-01: 2 g via INTRAVENOUS
  Filled 2023-07-01: qty 100

## 2023-07-01 MED ORDER — POVIDONE-IODINE 10 % EX SWAB
2.0000 | Freq: Once | CUTANEOUS | Status: AC
  Administered 2023-07-01: 2 via TOPICAL

## 2023-07-01 MED ORDER — WITCH HAZEL-GLYCERIN EX PADS: 1.0000 | MEDICATED_PAD | CUTANEOUS | Status: DC | PRN

## 2023-07-01 MED ORDER — SOD CITRATE-CITRIC ACID 500-334 MG/5ML PO SOLN
30.0000 mL | ORAL | Status: AC
  Administered 2023-07-01: 30 mL via ORAL

## 2023-07-01 MED ORDER — FENTANYL CITRATE (PF) 100 MCG/2ML IJ SOLN
INTRAMUSCULAR | Status: AC
  Filled 2023-07-01: qty 2

## 2023-07-01 MED ORDER — ONDANSETRON HCL 4 MG/2ML IJ SOLN: 4.0000 mg | Freq: Three times a day (TID) | INTRAMUSCULAR | Status: DC | PRN

## 2023-07-01 MED ORDER — KETOROLAC TROMETHAMINE 30 MG/ML IJ SOLN
30.0000 mg | Freq: Four times a day (QID) | INTRAMUSCULAR | Status: DC
  Administered 2023-07-01: 30 mg via INTRAVENOUS

## 2023-07-01 MED ORDER — DIPHENHYDRAMINE HCL 25 MG PO CAPS: 25.0000 mg | ORAL_CAPSULE | ORAL | Status: DC | PRN

## 2023-07-01 MED ORDER — PHENYLEPHRINE HCL-NACL 20-0.9 MG/250ML-% IV SOLN
INTRAVENOUS | Status: DC | PRN
  Administered 2023-07-01: 50 ug/min via INTRAVENOUS

## 2023-07-01 MED ORDER — NALOXONE HCL 0.4 MG/ML IJ SOLN: 0.4000 mg | INTRAMUSCULAR | Status: DC | PRN

## 2023-07-01 MED ORDER — BUPIVACAINE HCL (PF) 0.5 % IJ SOLN
INTRAMUSCULAR | Status: AC
  Filled 2023-07-01: qty 60

## 2023-07-01 MED ORDER — SIMETHICONE 80 MG PO CHEW
80.0000 mg | CHEWABLE_TABLET | ORAL | Status: DC | PRN
  Administered 2023-07-01: 80 mg via ORAL

## 2023-07-01 MED ORDER — ACETAMINOPHEN 500 MG PO TABS
1000.0000 mg | ORAL_TABLET | Freq: Four times a day (QID) | ORAL | Status: DC
  Administered 2023-07-01 – 2023-07-02 (×2): 1000 mg via ORAL
  Filled 2023-07-01 (×2): qty 2

## 2023-07-01 MED ORDER — MENTHOL 3 MG MT LOZG: 1.0000 | LOZENGE | OROMUCOSAL | Status: DC | PRN

## 2023-07-01 MED ORDER — OXYTOCIN-SODIUM CHLORIDE 30-0.9 UT/500ML-% IV SOLN
INTRAVENOUS | Status: DC | PRN
  Administered 2023-07-01: 30 [IU] via INTRAVENOUS

## 2023-07-01 MED ORDER — SENNOSIDES-DOCUSATE SODIUM 8.6-50 MG PO TABS
2.0000 | ORAL_TABLET | Freq: Every day | ORAL | Status: DC
  Administered 2023-07-02 – 2023-07-03 (×2): 2 via ORAL
  Filled 2023-07-01 (×2): qty 2

## 2023-07-01 MED ORDER — OXYTOCIN-SODIUM CHLORIDE 30-0.9 UT/500ML-% IV SOLN: 2.5000 [IU]/h | INTRAVENOUS | Status: DC

## 2023-07-01 MED ORDER — KETOROLAC TROMETHAMINE 30 MG/ML IJ SOLN: 30.0000 mg | Freq: Four times a day (QID) | INTRAMUSCULAR | Status: DC

## 2023-07-01 MED ORDER — PRENATAL MULTIVITAMIN CH
1.0000 | ORAL_TABLET | Freq: Every day | ORAL | Status: DC
  Administered 2023-07-02 – 2023-07-03 (×2): 1 via ORAL
  Filled 2023-07-01 (×2): qty 1

## 2023-07-01 MED ORDER — SOD CITRATE-CITRIC ACID 500-334 MG/5ML PO SOLN
ORAL | Status: AC
  Filled 2023-07-01: qty 15

## 2023-07-01 MED ORDER — ACETAMINOPHEN 500 MG PO TABS: 1000.0000 mg | ORAL_TABLET | Freq: Four times a day (QID) | ORAL | Status: DC

## 2023-07-01 SURGICAL SUPPLY — 26 items
BAG COUNTER SPONGE SURGICOUNT (BAG) ×1 IMPLANT
BENZOIN TINCTURE PRP APPL 2/3 (GAUZE/BANDAGES/DRESSINGS) IMPLANT
CHLORAPREP W/TINT 26 (MISCELLANEOUS) ×2 IMPLANT
DERMABOND ADVANCED .7 DNX12 (GAUZE/BANDAGES/DRESSINGS) IMPLANT
DRSG TELFA 3X8 NADH STRL (GAUZE/BANDAGES/DRESSINGS) ×1 IMPLANT
ELECT REM PT RETURN 9FT ADLT (ELECTROSURGICAL) ×1
ELECTRODE REM PT RTRN 9FT ADLT (ELECTROSURGICAL) ×1 IMPLANT
GAUZE SPONGE 4X4 12PLY STRL (GAUZE/BANDAGES/DRESSINGS) ×1 IMPLANT
GLOVE BIO SURGEON STRL SZ 6.5 (GLOVE) ×1 IMPLANT
GLOVE INDICATOR 7.0 STRL GRN (GLOVE) ×1 IMPLANT
GOWN STRL REUS W/ TWL LRG LVL3 (GOWN DISPOSABLE) ×2 IMPLANT
KIT TURNOVER KIT A (KITS) ×1 IMPLANT
MANIFOLD NEPTUNE II (INSTRUMENTS) ×1 IMPLANT
MAT PREVALON FULL STRYKER (MISCELLANEOUS) ×1 IMPLANT
NS IRRIG 1000ML POUR BTL (IV SOLUTION) ×1 IMPLANT
PACK C SECTION AR (MISCELLANEOUS) ×1 IMPLANT
PAD OB MATERNITY 4.3X12.25 (PERSONAL CARE ITEMS) ×1 IMPLANT
PAD PREP OB/GYN DISP 24X41 (PERSONAL CARE ITEMS) ×1 IMPLANT
RETRACTOR TRAXI PANNICULUS (MISCELLANEOUS) IMPLANT
SCRUB CHG 4% DYNA-HEX 4OZ (MISCELLANEOUS) ×1 IMPLANT
STRIP CLOSURE SKIN 1/2X4 (GAUZE/BANDAGES/DRESSINGS) IMPLANT
SUT MNCRL AB 4-0 PS2 18 (SUTURE) ×1 IMPLANT
SUT VIC AB 0 CT1 36 (SUTURE) ×4 IMPLANT
SUT VIC AB 3-0 SH 27X BRD (SUTURE) ×1 IMPLANT
TRAP FLUID SMOKE EVACUATOR (MISCELLANEOUS) ×1 IMPLANT
WATER STERILE IRR 500ML POUR (IV SOLUTION) ×1 IMPLANT

## 2023-07-01 NOTE — Telephone Encounter (Signed)
Chart reviewed. Patient report to L&D and has delivered.

## 2023-07-01 NOTE — Op Note (Addendum)
Cesarean Section Procedure Note  Indications: previous C-section x 1, declining vaginal attempt, dichorionic-diamniotic twin pregnancy  Pre-operative Diagnosis: 37 week 1 day pregnancy, history of C-section x 1 declining vaginal attempt, dichorionic-diamniotic twin pregnancy, obesity in pregnancy (BMI 39.9).  Post-operative Diagnosis: Same  Surgeon: Hildred Laser, MD  Assistants:  Raeford Razor, CNM  Procedure: Repeat Low Transverse Cesarean Section  Anesthesia: Spinal anesthesia  Findings: Twin A: Female infant, cephalic presentation, 2580 grams, with Apgar scores of 8 at one minute and 9 at five minutes. Twin B: Female infant, transverse lie delivered via complete breech presentation, 2580 grams, with Apgar scores of 7 at one minute and 8 at five minutes. Intact placenta with 3 vessel cord x 2 Clear amniotic fluid at amniotomy x 2 The uterine outline, tubes and ovaries appeared normal.   Procedure Details: The patient was seen in the Holding Room. The risks, benefits, complications, treatment options, and expected outcomes were discussed with the patient.  The patient concurred with the proposed plan, giving informed consent.  The site of surgery properly noted/marked. The patient was taken to the Operating Room, identified as Jessica Davidson and the procedure verified as C-Section Delivery. A Time Out was held and the above information confirmed.  After induction of anesthesia, the patient was draped and prepped in the usual sterile manner. A Traxi retraction device was used to elevated the pannus for better exposure.  Anesthesia was tested and noted to be adequate. A Pfannenstiel incision was made and carried down through the subcutaneous tissue to the fascia. Fascial incision was made and extended transversely. The fascia was separated from the underlying rectus tissue superiorly and inferiorly. The peritoneum was identified and entered. Peritoneal incision was extended longitudinally. The  surgical assist was able to provide retraction to allow for clear visualization of surgical site. An Alexis retractor was placed in the abdomen for additional retraction. The utero-vesical peritoneal reflection was incised transversely and the bladder flap was bluntly freed from the lower uterine segment. A low transverse uterine incision was made. The first amniotic sac was ruptured with clear fluid. Delivered from cephalic presentation was a 2580 gram Female with Apgar scores of 8 at one minute and 9 at five minutes.  The assistant was able to apply adequate fundal pressure to allow for successful delivery of the fetus. After the umbilical cord was clamped and cut, cord blood was obtained for evaluation.  Delayed cord clamping was observed. The second amniotic sac was ruptured with copious clear fluid.  Delivered from transverse (rotated to complete breech) presentation was a 2580 gram Female with Apgar scores of 7 at one minute and 8 at five minutes.  The assistant was able to apply adequate fundal pressure again to allow for successful delivery of the fetus. After the umbilical cord was clamped and cut, cord blood was obtained for evaluation.  Delayed cord clamping was observed. The placentas were removed intact and appeared normal. Attempts were made to exteriorize the uterus, but was only able to mobilize halfway through the incision due to uterine size.  The decision was made to leave the uterus within the pelvis and repair the incision interiorly. Visualization of the uterine outline, tubes and ovaries appeared normal.  The uterine incision was closed with running locked sutures of 0-Vicryl.  A second suture of 0-Vicryl was used in an imbricating layer.  A figure-of-eight suture was placed on the left lateral edge of the incision for hemostasis. Hemostasis was observed. The pericolic gutters were cleared of all clots  and debris. The fascia was then reapproximated with a running suture of 0-Vicryl. The  subcutaneous fat layer was reapproximated with 2-0 Vicryl. The skin was reapproximated with 4-0 Monocryl. The incision was covered with Dermabond.  A lidocaine patch was placed on the skin surrounding the incision.   Instrument, sponge, and needle counts were correct prior the abdominal closure and at the conclusion of the case.    An experienced assistant was required given the standard of surgical care given the complexity of the case.  This assistant was needed for exposure, dissection, suctioning, retraction, instrument exchange, and for overall help during the procedure.  Estimated Blood Loss:  815 ml      Drains: foley catheter to gravity drainage, 200 ml of clear urine at end of the procedure         Total IV Fluids: 1000 ml  Specimens: Cord blood of both umbilical cords         Implants: None         Complications:  None; patient tolerated the procedure well.         Disposition: PACU - hemodynamically stable.         Condition: stable   Hildred Laser, MD [facility]

## 2023-07-01 NOTE — Op Note (Signed)
Obstetric Preoperative History and Physical  Jessica Davidson is a 32 y.o. G3P1011 with IUP at [redacted]w[redacted]d presenting for presenting for scheduled repeat cesarean section, currently with di-di twin pregnancy. History of prior C-section x 1. No acute concerns.   Prenatal Course Source of Care: [facility] with onset of care at 13 weeks  Pregnancy complications or risks: Patient Active Problem List   Diagnosis Date Noted   Labor and delivery, indication for care 06/28/2023   Anemia affecting pregnancy 04/25/2023   Obesity affecting pregnancy, antepartum 04/18/2023   Twin pregnancy, twins dichorionic and diamniotic 04/02/2023   History of cesarean delivery 04/02/2023   Trichomonal vaginitis during pregnancy in first trimester 01/17/2023   Marijuana use during pregnancy 12/30/2022   Supervision of high risk pregnancy, antepartum 12/05/2022   She plans to breastfeed She desires oral progesterone-only contraceptive for postpartum contraception.   Prenatal labs and studies: ABO, Rh: --/--/PENDING (12/02 2703) Antibody: PENDING (12/02 0651) Rubella: 1.23 (05/28 1023) RPR: NON REACTIVE (11/29 1710)  HBsAg: Negative (05/28 1023)  HIV: Non Reactive (09/25 1110)  JKK:XFGHWEXH/-- (11/20 1619) 1 hr Glucola  114 Genetic screening normal Anatomy US normal   Past Medical History:  Diagnosis Date   Anemia    Dichorionic diamniotic twin pregnancy    GERD (gastroesophageal reflux disease)    History of cesarean delivery    Irregular contractions 04/08/2016   Marijuana use during pregnancy    Obesity affecting pregnancy    Trichomonal vaginitis during pregnancy     Past Surgical History:  Procedure Laterality Date   CESAREAN SECTION N/A 04/26/2016   Procedure: CESAREAN SECTION;  Surgeon: Vena Austria, MD;  Location: [facility] ORS;  Service: Obstetrics;  Laterality: N/A;   TONSILLECTOMY     WISDOM TOOTH EXTRACTION  2018   four    OB History  Gravida Para Term Preterm AB Living  3 1 1  0 1  1  SAB IAB Ectopic Multiple Live Births  1 0 0 0 1    # Outcome Date GA Lbr Len/2nd Weight Sex Type Anes PTL Lv  3 Current           2 Term 04/26/16 [redacted]w[redacted]d  3884 g F CS-Vac  N LIV     Birth Comments: umbilical cord wrapped around baby's neck     Complications: Fetal Intolerance  1 SAB 2013            Social History   Socioeconomic History   Marital status: Single    Spouse name: Not on file   Number of children: 1   Years of education: 12   Highest education level: Not on file  Occupational History   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  Tobacco Use   Smoking status: Former    Current packs/day: 0.00    Types: Cigarettes    Quit date: 11/15/2022    Years since quitting: 0.6   Smokeless tobacco: Never  Vaping Use   Vaping status: Never Used  Substance and Sexual Activity   Alcohol use: Not Currently    Alcohol/week: 4.0 standard drinks of alcohol    Types: 4 Shots of liquor per week    Comment: last use 10/29/22- tequila "3 to 4 shots"   Drug use: Not Currently    Types: Marijuana    Comment: during pregnancy   Sexual activity: Yes    Partners: Male    Birth control/protection: None, Pill    Comment: stopped ocp 2022 / hx nexplanon 2017- "constant bleeding"  Other Topics  Concern   Not on file  Social History Narrative   Not on file   Social Determinants of Health   Financial Resource Strain: Low Risk  (12/05/2022)   Overall Financial Resource Strain (CARDIA)    Difficulty of Paying Living Expenses: Not hard at all  Food Insecurity: No Food Insecurity (07/01/2023)   Hunger Vital Sign    Worried About Running Out of Food in the Last Year: Never true    Ran Out of Food in the Last Year: Never true  Transportation Needs: No Transportation Needs (07/01/2023)   PRAPARE - Administrator, Civil Service (Medical): No    Lack of Transportation (Non-Medical): No  Physical Activity: Sufficiently Active (12/05/2022)   Exercise Vital Sign    Days of Exercise per Week: 7  days    Minutes of Exercise per Session: 40 min  Stress: No Stress Concern Present (12/05/2022)   [employer] of Occupational Health - Occupational Stress Questionnaire    Feeling of Stress : Not at all  Social Connections: Moderately Integrated (12/05/2022)   Social Connection and Isolation Panel [NHANES]    Frequency of Communication with Friends and Family: More than three times a week    Frequency of Social Gatherings with Friends and Family: More than three times a week    Attends Religious Services: More than 4 times per year    Active Member of [employer] or Organizations: No    Attends Engineer, structural: Never    Marital Status: Living with partner    Family History  Problem Relation Age of Onset   Diabetes Mother    Hypertension Mother    Hypertension Father    Healthy Sister    Healthy Sister    Healthy Sister    Healthy Brother    Cancer Maternal Grandmother 30       colon   Multiple sclerosis Maternal Grandfather    Hypertension Paternal Grandmother    Diabetes Paternal Grandfather    Asthma Neg Hx    Heart disease Neg Hx     Medications Prior to Admission  Medication Sig Dispense Refill Last Dose   aspirin EC 81 MG tablet Take 2 tablets (162 mg total) by mouth daily. Swallow whole. 180 tablet 3 06/30/2023   ferrous sulfate 325 (65 FE) MG tablet Take 1 tablet (325 mg total) by mouth every other day. (Patient taking differently: Take 325 mg by mouth every other day.) 60 tablet 2 06/30/2023   folic acid (FOLVITE) 800 MCG tablet Take 800 mcg by mouth daily.   06/30/2023   Prenatal Vit-Fe Fumarate-FA (PRENATAL VITAMIN) 27-0.8 MG TABS Take 1 tablet by mouth daily at 6 (six) AM. 30 tablet 9 06/30/2023   docusate sodium (COLACE) 100 MG capsule Take 100 mg by mouth daily as needed for mild constipation. (Patient not taking: Reported on 07/01/2023)   Not Taking   ondansetron (ZOFRAN) 4 MG tablet Take 1 tablet (4 mg total) by mouth every 8 (eight) hours as needed for  nausea or vomiting. (Patient not taking: Reported on 07/01/2023) 20 tablet 1 Not Taking    Allergies  Allergen Reactions   Amoxicillin Rash    Review of Systems: Negative except for what is mentioned in HPI.  Physical Exam: BP 116/78 (BP Location: Left Arm)   Pulse 92   Temp 98.1 F (36.7 C) (Oral)   Resp 16   Ht 5\' 8"  (1.727 m)   Wt 119.3 kg   LMP 09/05/2022 (Within Days)  SpO2 100%   BMI 39.99 kg/m  FHR by Doppler: 145/140 bpm GENERAL: Well-developed, well-nourished female in no acute distress.  LUNGS: Clear to auscultation bilaterally.  HEART: Regular rate and rhythm. ABDOMEN: Soft, nondistended, gravid, Mildly tender with mild edema of pannus, Well-healed Pfannenstiel incision. PELVIC: Deferred EXTREMITIES: Nontender, no edema, 2+ distal pulses.   Pertinent Labs/Studies:   Results for orders placed or performed during the hospital encounter of 07/01/23 (from the past 72 hour(s))  Type and screen     Status: None (Preliminary result)   Collection Time: 07/01/23  6:51 AM  Result Value Ref Range   ABO/RH(D) PENDING    Antibody Screen PENDING    Sample Expiration      07/04/2023,2359 Performed at [facility], [address] [place], [place] [address]   CBC     Status: Abnormal   Collection Time: 07/01/23  6:53 AM  Result Value Ref Range   WBC 6.8 4.0 - 10.5 K/uL   RBC 3.84 (L) 3.87 - 5.11 MIL/uL   Hemoglobin 11.2 (L) 12.0 - 15.0 g/dL   HCT 95.6 (L) 38.7 - 56.4 %   MCV 85.4 80.0 - 100.0 fL   MCH 29.2 26.0 - 34.0 pg   MCHC 34.1 30.0 - 36.0 g/dL   RDW 33.2 95.1 - 88.4 %   Platelets 229 150 - 400 K/uL   nRBC 0.0 0.0 - 0.2 %    Comment: Performed at [facility], [address], [place], [place] [address]    Assessment and Plan :Jessica Davidson is a 32 y.o. G3P1011 at [redacted]w[redacted]d being admitted  for scheduled cesarean section delivery . The patient is understanding of the planned procedure and is aware of and accepting of all surgical risks, including  but not limited to: bleeding which may require transfusion or reoperation; infection which may require antibiotics; injury to bowel, bladder, ureters or other surrounding organs which may require repair; injury to the fetus; need for additional procedures including hysterectomy in the event of life-threatening complications; placental abnormalities wth subsequent pregnancies; incisional problems; blood clot disorders which may require blood thinners;, and other postoperative/anesthesia complications. The patient is in agreement with the proposed plan, and gives informed written consent for the procedure. All questions have been answered.   Hildred Laser, MD [facility] at [facility]

## 2023-07-01 NOTE — Transfer of Care (Signed)
Immediate Anesthesia Transfer of Care Note  Patient: Jessica Davidson  Procedure(s) Performed: REPEAT CESAREAN SECTION  Patient Location: PACU  Anesthesia Type:Spinal  Level of Consciousness: awake, alert , and oriented  Airway & Oxygen Therapy: Patient Spontanous Breathing  Post-op Assessment: Report given to RN and Post -op Vital signs reviewed and stable  Post vital signs: Reviewed and stable  Last Vitals:  Vitals Value Taken Time  BP 108/67 07/01/23 1023  Temp    Pulse 91 07/01/23 1023  Resp 11 07/01/23 1023  SpO2 100 % 07/01/23 1023    Last Pain:  Vitals:   07/01/23 0655  TempSrc:   PainSc: 0-No pain         Complications: No notable events documented.

## 2023-07-01 NOTE — Anesthesia Procedure Notes (Addendum)
Spinal  Patient location during procedure: OR Start time: 07/01/2023 8:45 AM End time: 07/01/2023 8:52 AM Reason for block: surgical anesthesia Staffing Performed: anesthesiologist and resident/CRNA  Anesthesiologist: Corinda Gubler, MD Resident/CRNA: Karoline Caldwell, CRNA Performed by: Karoline Caldwell, CRNA Authorized by: Corinda Gubler, MD   Preanesthetic Checklist Completed: patient identified, IV checked, site marked, risks and benefits discussed, surgical consent, monitors and equipment checked, pre-op evaluation and timeout performed Spinal Block Patient position: sitting Prep: ChloraPrep Patient monitoring: heart rate, continuous pulse ox, blood pressure and cardiac monitor Approach: midline Location: L3-4 Injection technique: single-shot Needle Needle type: Quincke  Needle gauge: 22 G Needle length: 9 cm Assessment Sensory level: T4 Events: CSF return Additional Notes Sterile aseptic technique used throughout the procedure.  Negative paresthesia. Negative blood return. Positive free-flowing CSF. Expiration date of kit checked and confirmed. Patient tolerated procedure well, without complications.  1st attempt by Lawerance Cruel, CRNA unsuccessful utilizing 25g whitacre needle, continually encountered os.  2nd attempt by Suzan Slick, MD unsuccessful with same needle; switched to 22g Quincke with [place] success.

## 2023-07-01 NOTE — Anesthesia Preprocedure Evaluation (Signed)
Anesthesia Evaluation  Patient identified by MRN, date of birth, ID band Patient awake    Reviewed: Allergy & Precautions, NPO status , Patient's Chart, lab work & pertinent test results  History of Anesthesia Complications Negative for: history of anesthetic complications  Airway Mallampati: II  TM Distance: >3 FB Neck ROM: Full    Dental no notable dental hx. (+) Teeth Intact   Pulmonary neg pulmonary ROS, neg sleep apnea, neg COPD, Patient abstained from smoking.Not current smoker, former smoker   Pulmonary exam normal breath sounds clear to auscultation       Cardiovascular Exercise Tolerance: Good METS(-) hypertension+ DOE  (-) CAD and (-) Past MI (-) dysrhythmias  Rhythm:Regular Rate:Normal - Systolic murmurs    Neuro/Psych negative neurological ROS  negative psych ROS   GI/Hepatic ,GERD  ,,(+)     (-) substance abuse    Endo/Other  neg diabetes    Renal/GU negative Renal ROS     Musculoskeletal   Abdominal  (+) + obese  Peds  Hematology   Anesthesia Other Findings Past Medical History: No date: Anemia No date: Dichorionic diamniotic twin pregnancy No date: GERD (gastroesophageal reflux disease) No date: History of cesarean delivery 04/08/2016: Irregular contractions No date: Marijuana use during pregnancy No date: Obesity affecting pregnancy No date: Trichomonal vaginitis during pregnancy  Reproductive/Obstetrics (+) Pregnancy                             Anesthesia Physical Anesthesia Plan  ASA: 2  Anesthesia Plan: Spinal   Post-op Pain Management:    Induction:   PONV Risk Score and Plan: 4 or greater and Ondansetron and Dexamethasone  Airway Management Planned: Natural Airway  Additional Equipment:   Intra-op Plan:   Post-operative Plan:   Informed Consent: I have reviewed the patients History and Physical, chart, labs and discussed the procedure including  the risks, benefits and alternatives for the proposed anesthesia with the patient or authorized representative who has indicated his/her understanding and acceptance.       Plan Discussed with: CRNA and Surgeon  Anesthesia Plan Comments: (Discussed R/B/A of neuraxial anesthesia technique with patient: - rare risks of spinal/epidural hematoma, nerve damage, infection - Risk of PDPH - Risk of itching - Risk of nausea and vomiting - Risk of conversion to general anesthesia and its associated risks, including sore throat, damage to lips/teeth/oropharynx, and rare risks such as cardiac and respiratory events. - Risk of surgical bleeding requiring blood products - Risk of allergic reactions Discussed the role of CRNA in patient's perioperative care.  Patient voiced understanding.)       Anesthesia [place] Evaluation

## 2023-07-01 NOTE — Lactation Note (Signed)
This note was copied from a baby's chart. Lactation Consultation Note  Patient Name: Jessica Davidson [id number] Date: 07/01/2023 Age:30 hours Reason for consult: Follow-up assessment;Mother's request;Early term 37-38.6wks;Other (Comment) (Pumping assistance)  Lactation called to room by patient to assist w/ pumping.   Feeding Mother's Current Feeding Choice: Breast Milk and Formula Nipple Type: Slow - flow  Lactation Tools Discussed/Used Tools: Pump Breast pump type: Double-Electric Breast Pump Pump Education: Setup, frequency, and cleaning Reason for Pumping: Extra Stimulation of the breast Pumping frequency: q3 Pumped volume: 0 mL  LC assisted patient with a pumping session.  Patient is using a 24mm flange.  No drops of colostrum were seen in the pump.  Encouraged mom to offer infant breast but if infants do not stimulate the breast pump.   Interventions Interventions: Breast feeding basics reviewed;DEBP;Education  Discharge Pump: Advised to call insurance company [facility] Program: Yes  Consult Status Consult Status: Follow-up Follow-up type: In-patient    Yvette Rack Deitrich Steve 07/01/2023, 5:18 PM

## 2023-07-02 ENCOUNTER — Encounter: Payer: Self-pay | Admitting: Obstetrics and Gynecology

## 2023-07-02 LAB — CBC
HCT: 26.2 % — ABNORMAL LOW (ref 36.0–46.0)
Hemoglobin: 9.2 g/dL — ABNORMAL LOW (ref 12.0–15.0)
MCH: 29.9 pg (ref 26.0–34.0)
MCHC: 35.1 g/dL (ref 30.0–36.0)
MCV: 85.1 fL (ref 80.0–100.0)
Platelets: 214 10*3/uL (ref 150–400)
RBC: 3.08 MIL/uL — ABNORMAL LOW (ref 3.87–5.11)
RDW: 14.2 % (ref 11.5–15.5)
WBC: 10.7 10*3/uL — ABNORMAL HIGH (ref 4.0–10.5)
nRBC: 0 % (ref 0.0–0.2)

## 2023-07-02 MED ORDER — IBUPROFEN 600 MG PO TABS
600.0000 mg | ORAL_TABLET | Freq: Four times a day (QID) | ORAL | Status: DC
  Administered 2023-07-02 – 2023-07-03 (×3): 600 mg via ORAL
  Filled 2023-07-02 (×3): qty 1

## 2023-07-02 MED ORDER — ACETAMINOPHEN 500 MG PO TABS
1000.0000 mg | ORAL_TABLET | Freq: Four times a day (QID) | ORAL | Status: DC
  Administered 2023-07-02 – 2023-07-03 (×5): 1000 mg via ORAL
  Filled 2023-07-02 (×5): qty 2

## 2023-07-02 MED ORDER — KETOROLAC TROMETHAMINE 30 MG/ML IJ SOLN
30.0000 mg | Freq: Four times a day (QID) | INTRAMUSCULAR | Status: AC
  Administered 2023-07-02 (×2): 30 mg via INTRAVENOUS
  Filled 2023-07-02 (×2): qty 1

## 2023-07-02 NOTE — Anesthesia Post-op Follow-up Note (Signed)
  Anesthesia Pain Follow-up Note  Patient: Jessica Davidson  Day #: 1  Date of Follow-up: 07/02/2023 Time: 8:51 AM  Last Vitals:  Vitals:   07/02/23 0300 07/02/23 0819  BP:  115/76  Pulse: 71   Resp:  18  Temp:    SpO2: 100%     Level of Consciousness: alert  Pain: none   Side Effects:None  Catheter Site Exam:clean, dry, no drainage     Plan: D/C from anesthesia care at surgeon's request  Karoline Caldwell

## 2023-07-02 NOTE — Discharge Summary (Signed)
Postpartum Discharge Summary  Date of Service updated 07/03/2023     Patient Name: Jessica Davidson DOB: 1990-11-14 MRN: [id number]  Date of admission: 07/01/2023 Delivery date: 07/01/2023  Delivering provider: Hildred Laser, MD  Date of discharge: 07/03/2023  Admitting diagnosis: S/P cesarean section [Z98.891] Intrauterine pregnancy: [redacted]w[redacted]d     Secondary diagnosis:  Active Problems:   Marijuana use during pregnancy   Twin pregnancy, twins dichorionic and diamniotic   History of cesarean delivery   Obesity affecting pregnancy, antepartum   Anemia affecting pregnancy (iron deficiency)  Additional problems: None    Discharge diagnosis: Term Pregnancy Delivered and Anemia                                              Post partum procedures: None Augmentation: N/A Complications: None  Hospital course: Sceduled C/S   32 y.o. yo G3P2013 at [redacted]w[redacted]d was admitted to the hospital 07/01/2023 for scheduled cesarean section with the following indication:Elective Repeat.Delivery details are as follows:  Membrane Rupture Time/Date:    Laqueisha, Bateman [[id number]]  9:24 AM    Debara Pickett [[id number]]  9:27 AM,   Novalene, Groseclose [[id number]]  07/01/2023    Dahlila, Hrivnak [[id number]]  07/01/2023  Delivery Method:   Everlean Patterson [[id number]]  C-Section, Low Transverse    Lorimar, Smalls [[id number]]  C-Section, Low Transverse Operative Delivery:N/A Details of operation can be found in separate operative note.  Patient had a postpartum course that was uncomplicated.  She is ambulating, tolerating a regular diet, passing flatus, and urinating well. Patient is discharged home in stable condition on  07/03/23        Newborn Data: Birth date:   Cherlynn, Velie [[id number]]  07/01/2023    Kamalpreet, Seman [[id number]]  07/01/2023 Birth time:   Ambree, Herson [[id number]]  9:25 AM    Debara Pickett [[id number]]  9:28 AM Gender:   Rubee, Conwill [[id number]]  Female    Byrle, Sundermeyer [[id number]]  Female Living status:   Gwyneth, Rousselle [[id number]]  Living    Meike, Brasington [[id number]]  Living Apgars:   Chiana, Macartney [[id number]]  [address] [[id number]]  7 ,   Litsy, Drakeford [[id number]]  [address] [[id number]]  8  Weight:   Daylyn, Ugarte [[id number]]  2580 g    Sharvi, Kasner [[id number]]  2780 g    Magnesium Sulfate received: No BMZ received: No Rhophylac:No MMR:No T-DaP:Given prenatally Flu: No (declined) RSV Vaccine received: Yes Transfusion:No Immunizations administered: Immunization History  Administered Date(s) Administered   Rsv, Bivalent, Protein Subunit Rsvpref,pf Verdis Frederickson) 06/19/2023   Tdap 05/08/2023    Physical exam  Vitals:   07/02/23 0819 07/02/23 1702 07/02/23 2315 07/03/23 0804  BP: 115/76 128/77 107/73 108/69  Pulse: 67 64 67 66  Resp: 18 18 18 18   Temp: (!) 97.4 F (36.3 C) (!) 97.5 F (36.4 C) 97.7 F (36.5 C) 98.6 F (37 C)  TempSrc: Oral Oral Oral Oral  SpO2: 100% 99% 99% 100%  Weight:      Height:       General: alert, cooperative, and no distress Lochia: appropriate Uterine Fundus: firm Incision: Healing well with no significant drainage, No significant erythema, Dressing is clean, dry, and intact DVT Evaluation:  No evidence of DVT seen on physical exam. Negative Homan's sign. No cords or calf tenderness. No significant calf/ankle edema. Labs: Lab Results  Component Value Date   WBC 10.7 (H) 07/02/2023   HGB 9.2 (L) 07/02/2023   HCT 26.2 (L) 07/02/2023   MCV 85.1 07/02/2023   PLT 214 07/02/2023      Latest Ref Rng & Units 02/05/2019    8:57 AM  CMP  Glucose 70 - 99 mg/dL 161   BUN 6 - 20 mg/dL 12   Creatinine 0.96 - 1.00 mg/dL 0.45   Sodium 409 - 811 mmol/L 143   Potassium 3.5 - 5.1 mmol/L 3.7   Chloride 98 - 111 mmol/L 110   CO2 22 - 32 mmol/L 23   Calcium 8.9 - 10.3 mg/dL 9.7    Total Protein 6.5 - 8.1 g/dL 7.8   Total Bilirubin 0.3 - 1.2 mg/dL 0.4   Alkaline Phos 38 - 126 U/L 69   AST 15 - 41 U/L 16   ALT 0 - 44 U/L 13    Edinburgh Score:    07/01/2023    6:00 PM  Edinburgh Postnatal Depression Scale Screening Tool  I have been able to laugh and see the funny side of things. 0  I have looked forward with enjoyment to things. 0  I have blamed myself unnecessarily when things went wrong. 0  I have been anxious or worried for no good reason. 2  I have felt scared or panicky for no good reason. 0  Things have been getting on top of me. 0  I have been so unhappy that I have had difficulty sleeping. 0  I have felt sad or miserable. 0  I have been so unhappy that I have been crying. 0  The thought of harming myself has occurred to me. 0  Edinburgh Postnatal Depression Scale Total 2      After visit meds:  Allergies as of 07/03/2023       Reactions   Amoxicillin Rash        Medication List     STOP taking these medications    aspirin EC 81 MG tablet   docusate sodium 100 MG capsule Commonly known as: COLACE   ferrous sulfate 325 (65 FE) MG tablet   folic acid 800 MCG tablet Commonly known as: FOLVITE   ondansetron 4 MG tablet Commonly known as: Zofran       TAKE these medications    ibuprofen 600 MG tablet Commonly known as: ADVIL Take 1 tablet (600 mg total) by mouth every 6 (six) hours.   oxyCODONE 5 MG immediate release tablet Commonly known as: Oxy IR/ROXICODONE Take 1-2 tablets (5-10 mg total) by mouth every 4 (four) hours as needed for moderate pain (pain score 4-6).   Prenatal Vitamin 27-0.8 MG Tabs Take 1 tablet by mouth daily at 6 (six) AM.               Discharge Care Instructions  (From admission, onward)           Start     Ordered   07/03/23 0000  Leave dressing on - Keep it clean, dry, and intact until clinic visit        07/03/23 1245             Discharge home in stable condition Infant  Feeding: Bottle and Breast Infant Disposition:home with mother Discharge instruction: per After Visit Summary and Postpartum booklet. Activity: Advance as tolerated. Pelvic rest  for 6 weeks.  Diet: routine diet Anticipated Birth Control: OCPs Postpartum Appointment:6 weeks Additional Postpartum F/U: Postpartum Depression checkup and Incision check 1 week Future Appointments: Future Appointments  Date Time Provider Department Center  07/09/2023  2:35 PM Hildred Laser, MD [facility] None   Follow up Visit:  Follow-up Information     Hildred Laser, MD Follow up in 1 week(s).   Specialties: Obstetrics and Gynecology, Radiology Why: post-operative visit Contact information: [address] [place] [address] [phone]                     07/03/2023 Mirna Mires, CNM

## 2023-07-02 NOTE — Progress Notes (Signed)
Postpartum Day # 1: Cesarean Delivery (repeat)  Subjective: Patient reports tolerating PO and + flatus.  Is ambulating some. Has not yet voided. Pain is well controlled with medications. Is breast (pumping) and formula feeding.   Objective: Vital signs in last 24 hours: Temp:  [97.8 F (36.6 C)-98.5 F (36.9 C)] 98.1 F (36.7 C) (12/02 2342) Pulse Rate:  [60-99] 71 (12/03 0300) Resp:  [11-30] 20 (12/02 2342) BP: (99-130)/(58-86) 111/71 (12/02 2342) SpO2:  [98 %-100 %] 100 % (12/03 0300)  Physical Exam:  General: alert and no distress Lungs: normal percussion bilaterally Breasts: normal appearance, no masses or tenderness Heart: regular rate and rhythm, S1, S2 normal, no murmur, click, rub or gallop Abdomen: soft, non-tender; bowel sounds normal; no masses,  no organomegaly Pelvis: Lochia appropriate, Uterine Fundus firm, Incision: bandage slightly saturated at base, however saturation is above incision line.   Extremities: DVT Evaluation: No evidence of DVT seen on physical exam. Negative Homan's sign. No cords or calf tenderness. No significant calf/ankle edema.  Recent Labs    07/01/23 0653 07/02/23 0542  HGB 11.2* 9.2*  HCT 32.8* 26.2*    Assessment/Plan: Status post Cesarean section. Doing well postoperatively.  Breastfeeding and formula feeding Contraception OCPs Regular diet Continue PO pain management Discontinue IVF once voiding and tolerating full diet.  Iron deficiency anemia of pregnancy, stable, asymptomatic.  Continue current care.  Plan for discharge tomorrow.    Hildred Laser, MD [facility] at [facility]

## 2023-07-02 NOTE — Anesthesia Postprocedure Evaluation (Signed)
Anesthesia Post Note  Patient: Jessica Davidson  Procedure(s) Performed: REPEAT CESAREAN SECTION  Patient location during evaluation: Mother Baby Anesthesia Type: Spinal Level of consciousness: oriented and awake and alert Pain management: pain level controlled Vital Signs Assessment: post-procedure vital signs reviewed and stable Respiratory status: spontaneous breathing and respiratory function stable Cardiovascular status: blood pressure returned to baseline and stable Postop Assessment: no headache, no backache, no apparent nausea or vomiting and able to ambulate Anesthetic complications: no   No notable events documented.   Last Vitals:  Vitals:   07/02/23 0300 07/02/23 0819  BP:  115/76  Pulse: 71   Resp:  18  Temp:    SpO2: 100%     Last Pain:  Vitals:   07/02/23 0605  TempSrc:   PainSc: 5                  Monicka Cyran Lawerance Cruel

## 2023-07-02 NOTE — Lactation Note (Signed)
This note was copied from a baby's chart. Lactation Consultation Note  Patient Name: Alaia Kapoor [id number] Date: 07/02/2023 Age:30 hours  Reason for consult: Follow-up assessment; Early term 21-38.6wks; RN request    Maternal Data  Mom is a [id number]. Mom had a repeat C/S for twin gestation(di/di twin girls). Mom with history of obesity, anemia,  tobacco and MJ use in pregnancy(mom negative on admission).  On follow-up today mom is focusingon bottle feed twins and pumping. Mom reports she is following every 3 hour pumping schedule recommended by [facility] yesterday. Mom with concerns about expressed volumes of colostrum being low.  Feeding  Mother's Feeding Choice on Admission: breastmilk and formula Nipple type: slow flow  Lactation Tools Discussed/Used  DEBP   Interventions  DEBP, and education. Reviewed normative milk volumes expressed in the early post partum period. Provided tips and strategies to maximize use of DEBP to maximize milk production. Support and encouragement provided. Mom has LC number on white board if she has any additional questions or would like pumping assistance today.  Discharge  Pump for home use: Personal  Consult Status  Follow-up 07/03/23 In-patient  Update provided to care nurse  Fuller Song 07/02/2023, 2:56 PM

## 2023-07-02 NOTE — TOC CM/SW Note (Signed)
CSW received consult for hx of marijuana use.  Referral was screened out due to the following: ~MOB had no documented substance use after initial prenatal visit/+UPT. ~MOB had no positive drug screens after initial prenatal visit/+UPT.  Checked with RN who denies additional TOC needs/concerns at this time.  Please consult CSW if current concerns arise or by MOB's request.  CSW will monitor CDS results and make report to Child Protective Services if warranted.   Alfonso Ramus, LCSW Transitions of Care Department [phone]

## 2023-07-03 ENCOUNTER — Ambulatory Visit: Payer: Medicaid Other

## 2023-07-03 MED ORDER — IBUPROFEN 600 MG PO TABS: 600.0000 mg | ORAL_TABLET | Freq: Four times a day (QID) | ORAL | 0 refills | Status: DC

## 2023-07-03 MED ORDER — OXYCODONE HCL 5 MG PO TABS: 5.0000 mg | ORAL_TABLET | ORAL | 0 refills | Status: DC | PRN

## 2023-07-03 NOTE — H&P (Signed)
Obstetric Preoperative History and Physical  Tiffney Rauls is a 32 y.o. [id number] with IUP at [redacted]w[redacted]d presenting for presenting for scheduled repeat cesarean section, currently with di-di twin pregnancy. History of prior C-section x 1. No acute concerns.   Prenatal Course Source of Care: [facility] with onset of care at 13 weeks  Pregnancy complications or risks: Patient Active Problem List   Diagnosis Date Noted   S/P cesarean section 07/01/2023   Labor and delivery, indication for care 06/28/2023   Anemia affecting pregnancy 04/25/2023   Obesity affecting pregnancy, antepartum 04/18/2023   Twin pregnancy, twins dichorionic and diamniotic 04/02/2023   History of cesarean delivery 04/02/2023   Trichomonal vaginitis during pregnancy in first trimester 01/17/2023   Marijuana use during pregnancy 12/30/2022   Supervision of high risk pregnancy, antepartum 12/05/2022   She plans to breastfeed She desires oral progesterone-only contraceptive for postpartum contraception.   Prenatal labs and studies: ABO, Rh: --/--/O POS (12/02 4540) Antibody: NEG (12/02 0651) Rubella: 1.23 (05/28 1023) RPR: NON REACTIVE (12/02 0653)  HBsAg: Negative (05/28 1023)  HIV: Non Reactive (09/25 1110)  JWJ:XBJYNWGN/-- (11/20 1619) 1 hr Glucola  114 Genetic screening normal Anatomy US normal   Past Medical History:  Diagnosis Date   Anemia    GERD (gastroesophageal reflux disease)    Irregular contractions 04/08/2016   Marijuana use during pregnancy    Obesity affecting pregnancy    Trichomonal vaginitis during pregnancy     Past Surgical History:  Procedure Laterality Date   CESAREAN SECTION N/A 04/26/2016   Procedure: CESAREAN SECTION;  Surgeon: Vena Austria, MD;  Location: [facility] ORS;  Service: Obstetrics;  Laterality: N/A;   CESAREAN SECTION MULTI-GESTATIONAL N/A 07/01/2023   Procedure: REPEAT CESAREAN SECTION;  Surgeon: Hildred Laser, MD;  Location: [facility] ORS;  Service: Obstetrics;   Laterality: N/A;   TONSILLECTOMY     WISDOM TOOTH EXTRACTION  2018   four    OB History  Gravida Para Term Preterm AB Living  3 2 2  0 1 3  SAB IAB Ectopic Multiple Live Births  1 0 0 1 3    # Outcome Date GA Lbr Len/2nd Weight Sex Type Anes PTL Lv  3A Term 07/01/23 [redacted]w[redacted]d  2580 g F CS-LTranv Spinal  LIV  3B Term 07/01/23 [redacted]w[redacted]d  2780 g F CS-LTranv Spinal  LIV  2 Term 04/26/16 [redacted]w[redacted]d  3884 g F CS-Vac  N LIV     Birth Comments: umbilical cord wrapped around baby's neck     Complications: Fetal Intolerance  1 SAB 2013            Social History   Socioeconomic History   Marital status: Single    Spouse name: Not on file   Number of children: 1   Years of education: 12   Highest education level: Not on file  Occupational History   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  Tobacco Use   Smoking status: Former    Current packs/day: 0.00    Types: Cigarettes    Quit date: 11/15/2022    Years since quitting: 0.6   Smokeless tobacco: Never  Vaping Use   Vaping status: Never Used  Substance and Sexual Activity   Alcohol use: Not Currently    Alcohol/week: 4.0 standard drinks of alcohol    Types: 4 Shots of liquor per week    Comment: last use 10/29/22- tequila "3 to 4 shots"   Drug use: Not Currently    Types: Marijuana    Comment:  during pregnancy   Sexual activity: Yes    Partners: Male    Birth control/protection: None, Pill    Comment: stopped ocp 2022 / hx nexplanon 2017- "constant bleeding"  Other Topics Concern   Not on file  Social History Narrative   Not on file   Social Determinants of Health   Financial Resource Strain: Low Risk  (12/05/2022)   Overall Financial Resource Strain (CARDIA)    Difficulty of Paying Living Expenses: Not hard at all  Food Insecurity: No Food Insecurity (07/01/2023)   Hunger Vital Sign    Worried About Running Out of Food in the Last Year: Never true    Ran Out of Food in the Last Year: Never true  Transportation Needs: No Transportation  Needs (07/01/2023)   PRAPARE - Administrator, Civil Service (Medical): No    Lack of Transportation (Non-Medical): No  Physical Activity: Sufficiently Active (12/05/2022)   Exercise Vital Sign    Days of Exercise per Week: 7 days    Minutes of Exercise per Session: 40 min  Stress: No Stress Concern Present (12/05/2022)   [employer] of Occupational Health - Occupational Stress Questionnaire    Feeling of Stress : Not at all  Social Connections: Moderately Integrated (12/05/2022)   Social Connection and Isolation Panel [NHANES]    Frequency of Communication with Friends and Family: More than three times a week    Frequency of Social Gatherings with Friends and Family: More than three times a week    Attends Religious Services: More than 4 times per year    Active Member of [employer] or Organizations: No    Attends Engineer, structural: Never    Marital Status: Living with partner    Family History  Problem Relation Age of Onset   Diabetes Mother    Hypertension Mother    Hypertension Father    Healthy Sister    Healthy Sister    Healthy Sister    Healthy Brother    Cancer Maternal Grandmother 30       colon   Multiple sclerosis Maternal Grandfather    Hypertension Paternal Grandmother    Diabetes Paternal Grandfather    Asthma Neg Hx    Heart disease Neg Hx     Medications Prior to Admission  Medication Sig Dispense Refill Last Dose   aspirin EC 81 MG tablet Take 2 tablets (162 mg total) by mouth daily. Swallow whole. 180 tablet 3 06/30/2023   ferrous sulfate 325 (65 FE) MG tablet Take 1 tablet (325 mg total) by mouth every other day. (Patient taking differently: Take 325 mg by mouth every other day.) 60 tablet 2 06/30/2023   folic acid (FOLVITE) 800 MCG tablet Take 800 mcg by mouth daily.   06/30/2023   Prenatal Vit-Fe Fumarate-FA (PRENATAL VITAMIN) 27-0.8 MG TABS Take 1 tablet by mouth daily at 6 (six) AM. 30 tablet 9 06/30/2023   docusate sodium (COLACE) 100  MG capsule Take 100 mg by mouth daily as needed for mild constipation. (Patient not taking: Reported on 07/01/2023)   Not Taking   ondansetron (ZOFRAN) 4 MG tablet Take 1 tablet (4 mg total) by mouth every 8 (eight) hours as needed for nausea or vomiting. (Patient not taking: Reported on 07/01/2023) 20 tablet 1 Not Taking    Allergies  Allergen Reactions   Amoxicillin Rash    Review of Systems: Negative except for what is mentioned in HPI.  Physical Exam: BP 108/69 (BP Location: Left  Arm)   Pulse 66   Temp 98.6 F (37 C) (Oral)   Resp 18   Ht 5\' 8"  (1.727 m)   Wt 119.3 kg   LMP 09/05/2022 (Within Days)   SpO2 100%   Breastfeeding Unknown   BMI 39.99 kg/m  FHR by Doppler: 145/140 bpm GENERAL: Well-developed, well-nourished female in no acute distress.  LUNGS: Clear to auscultation bilaterally.  HEART: Regular rate and rhythm. ABDOMEN: Soft, nondistended, gravid, Mildly tender with mild edema of pannus, Well-healed Pfannenstiel incision. PELVIC: Deferred EXTREMITIES: Nontender, no edema, 2+ distal pulses.   Pertinent Labs/Studies:   Results for orders placed or performed during the hospital encounter of 07/01/23 (from the past 72 hour(s))  Type and screen     Status: None   Collection Time: 07/01/23  6:51 AM  Result Value Ref Range   ABO/RH(D) O POS    Antibody Screen NEG    Sample Expiration      07/04/2023,2359 Performed at [facility], [address] [place], [place] [address]   CBC     Status: Abnormal   Collection Time: 07/01/23  6:53 AM  Result Value Ref Range   WBC 6.8 4.0 - 10.5 K/uL   RBC 3.84 (L) 3.87 - 5.11 MIL/uL   Hemoglobin 11.2 (L) 12.0 - 15.0 g/dL   HCT 02.7 (L) 25.3 - 66.4 %   MCV 85.4 80.0 - 100.0 fL   MCH 29.2 26.0 - 34.0 pg   MCHC 34.1 30.0 - 36.0 g/dL   RDW 40.3 47.4 - 25.9 %   Platelets 229 150 - 400 K/uL   nRBC 0.0 0.0 - 0.2 %    Comment: Performed at [facility], [address] [place], [place] [address]  RPR      Status: None   Collection Time: 07/01/23  6:53 AM  Result Value Ref Range   RPR Ser Ql NON REACTIVE NON REACTIVE    Comment: Performed at [facility], [address] [place], [place] [address]  Urine Drug Screen, Qualitative ([facility] only)     Status: None   Collection Time: 07/01/23 10:56 AM  Result Value Ref Range   Tricyclic, Ur Screen NONE DETECTED NONE DETECTED   Amphetamines, Ur Screen NONE DETECTED NONE DETECTED   MDMA (Ecstasy)Ur Screen NONE DETECTED NONE DETECTED   Cocaine Metabolite,Ur [place] NONE DETECTED NONE DETECTED   Opiate, Ur Screen NONE DETECTED NONE DETECTED   Phencyclidine (PCP) Ur S NONE DETECTED NONE DETECTED   Cannabinoid 50 Ng, Ur [place] NONE DETECTED NONE DETECTED   Barbiturates, Ur Screen NONE DETECTED NONE DETECTED   Benzodiazepine, Ur Scrn NONE DETECTED NONE DETECTED   Methadone Scn, Ur NONE DETECTED NONE DETECTED    Comment: (NOTE) Tricyclics + metabolites, urine    Cutoff 1000 ng/mL Amphetamines + metabolites, urine  Cutoff 1000 ng/mL MDMA (Ecstasy), urine              Cutoff 500 ng/mL Cocaine Metabolite, urine          Cutoff 300 ng/mL Opiate + metabolites, urine        Cutoff 300 ng/mL Phencyclidine (PCP), urine         Cutoff 25 ng/mL Cannabinoid, urine                 Cutoff 50 ng/mL Barbiturates + metabolites, urine  Cutoff 200 ng/mL Benzodiazepine, urine              Cutoff 200 ng/mL Methadone, urine  Cutoff 300 ng/mL  The urine drug screen provides only a preliminary, unconfirmed analytical test result and should not be used for non-medical purposes. Clinical consideration and professional judgment should be applied to any positive drug screen result due to possible interfering substances. A more specific alternate chemical method must be used in order to obtain a confirmed analytical result. Gas chromatography / mass spectrometry (GC/MS) is the preferred confirm atory method. Performed at [facility], [address] [place], [place] [address]   CBC     Status: Abnormal   Collection Time: 07/02/23  5:42 AM  Result Value Ref Range   WBC 10.7 (H) 4.0 - 10.5 K/uL   RBC 3.08 (L) 3.87 - 5.11 MIL/uL   Hemoglobin 9.2 (L) 12.0 - 15.0 g/dL   HCT 19.1 (L) 47.8 - 29.5 %   MCV 85.1 80.0 - 100.0 fL   MCH 29.9 26.0 - 34.0 pg   MCHC 35.1 30.0 - 36.0 g/dL   RDW 62.1 30.8 - 65.7 %   Platelets 214 150 - 400 K/uL   nRBC 0.0 0.0 - 0.2 %    Comment: Performed at [facility], [address], [place], [place] [address]    Assessment and Plan :Solara Wheatcroft is a 32 y.o. [phone] at [redacted]w[redacted]d being admitted  for scheduled cesarean section delivery . The patient is understanding of the planned procedure and is aware of and accepting of all surgical risks, including but not limited to: bleeding which may require transfusion or reoperation; infection which may require antibiotics; injury to bowel, bladder, ureters or other surrounding organs which may require repair; injury to the fetus; need for additional procedures including hysterectomy in the event of life-threatening complications; placental abnormalities wth subsequent pregnancies; incisional problems; blood clot disorders which may require blood thinners;, and other postoperative/anesthesia complications. The patient is in agreement with the proposed plan, and gives informed written consent for the procedure. All questions have been answered.   Hildred Laser, MD [facility] at [facility]

## 2023-07-03 NOTE — Lactation Note (Signed)
This note was copied from a baby's chart. Lactation Consultation Note  Patient Name: Jessica Davidson [id number] Date: 07/03/2023 Age:30 hours Reason for consult: Follow-up assessment;Early term 44-38.6wks (Discharge Education)  Follow up assessment w/ 51hr old twins and patient.  Feeding goal is   Feeding Mother's Current Feeding Choice: Breast Milk and Formula  Interventions Interventions: Education;CDC Guidelines for Breast Pump Cleaning  Discharge Discharge Education: Engorgement and breast care;Outpatient recommendation  Education on engorgement prevention/treatment was discussed as well as breastmilk storage guidelines.  LC provided patient with a handout on breastmilk storage guidelines from [facility]. [facility] outpatient lactation services phone number written on the white board in the room.  Patient verbalized understanding.  [facility] information was provide to mom.  Consult Status Consult Status: Complete Follow-up type: Call as needed    Hoda Hon S Sharona Rovner 07/03/2023, 1:50 PM

## 2023-07-03 NOTE — Progress Notes (Signed)
Pt discharged with infants.  Discharge instructions, prescriptions and follow up appointment given to and reviewed with pt. Pt verbalized understanding. Escorted out by auxillary.

## 2023-07-09 ENCOUNTER — Encounter: Payer: Self-pay | Admitting: Obstetrics and Gynecology

## 2023-07-09 ENCOUNTER — Ambulatory Visit: Payer: Medicaid Other | Admitting: Obstetrics and Gynecology

## 2023-07-09 VITALS — BP 145/78 | HR 61 | Resp 16 | Ht 68.0 in | Wt 253.1 lb

## 2023-07-09 DIAGNOSIS — Z4889 Encounter for other specified surgical aftercare: Secondary | ICD-10-CM

## 2023-07-09 DIAGNOSIS — R030 Elevated blood-pressure reading, without diagnosis of hypertension: Secondary | ICD-10-CM

## 2023-07-09 DIAGNOSIS — R600 Localized edema: Secondary | ICD-10-CM

## 2023-07-09 MED ORDER — FUROSEMIDE 20 MG PO TABS: 20.0000 mg | ORAL_TABLET | Freq: Every day | ORAL | 0 refills | Status: DC

## 2023-07-09 NOTE — Patient Instructions (Signed)

## 2023-07-09 NOTE — Progress Notes (Signed)
    OBSTETRICS/GYNECOLOGY POST-OPERATIVE CLINIC VISIT  Subjective:     Jessica Davidson is a 32 y.o.   female who presents to the clinic 1 weeks status post REPEAT CESAREAN SECTION  for History of previous C-section, Di-di twin pregnancy . Eating a regular diet without difficulty. Bowel movements are normal. Pain is controlled without any medications. She notes bilateral swelling her her feet. She tries to elevate her feet when she can. She denies having any other issues such as headache, blurred vision, abdominal pain.  Is breast and formula feeding.   The following portions of the patient's history were reviewed and updated as appropriate: allergies, current medications, past family history, past medical history, past social history, past surgical history, and problem list.  Review of Systems Pertinent items noted in HPI and remainder of comprehensive ROS otherwise negative.   Objective:   BP (!) 145/78   Pulse 61   Resp 16   Ht 5\' 8"  (1.727 m)   Wt 253 lb 1.6 oz (114.8 kg)   LMP 09/05/2022 (Within Days)   Breastfeeding Yes   BMI 38.48 kg/m  Body mass index is 38.48 kg/m.  General:  alert and no distress  Abdomen: soft, bowel sounds active, non-tender  Incision:  Honeycomb dressing removed. Incision healing well, no drainage, no erythema, no hernia, no seroma, no swelling, no dehiscence, incision well approximated, Dermabond covering incision. Small amount of pitting edema in lower abdomen (however improved since delivery.   Extremities:  +3 pitting edema in legs bilaterally to calves.    Pathology:  None  Assessment:   Patient s/p REPEAT CESAREAN SECTION   Doing well postoperatively. Pitting edema Elevated BP   Plan:   1. Continue any current medications as instructed by provider. Discussed initiation of Lasix for significant edema, to take for 5 days.  2. Wound care discussed. 3. BP mildly elevated today, asymptomatic. Will f/u in 1 week after diuresis, if still  elevated, will treat PP HTN. No symptoms of pre-E at this time.  4. Activity restrictions: no bending, stooping, or squatting, no lifting more than 10-15 pounds, and pelvic rest x 5 weeks. 5. Anticipated return to work:  4-5 weeks if applicable . 6. Follow up: 1 week for BP check, PP mood check and f/u edema.     Hildred Laser, MD [facility]

## 2023-07-16 ENCOUNTER — Ambulatory Visit (INDEPENDENT_AMBULATORY_CARE_PROVIDER_SITE_OTHER): Payer: Medicaid Other | Admitting: Obstetrics

## 2023-07-16 DIAGNOSIS — Z0130 Encounter for examination of blood pressure without abnormal findings: Secondary | ICD-10-CM

## 2023-07-16 DIAGNOSIS — Z1332 Encounter for screening for maternal depression: Secondary | ICD-10-CM

## 2023-07-16 NOTE — Progress Notes (Signed)
       History of Present Illness:   Jessica Davidson is a 32 y.o. G51P2013 female who presents for a 1 week follow up for mood and edema/BP check. She is 2 weeks postpartum following a cesarean delivery for repeat/didi twins. She saw Dr. Valentino Saxon last week for her 1wk incision check and had borderline BP with significant edema, was given a 5-day course of Lasix and instructed to follow up in another week.  Today she denies HA, vision changes, RUQ pain and states she took the Lasix, diuresed significantly and her swelling has gone down a lot. Today's postpartum depression screening: negative.  EDPS score is 0.     The following portions of the patient's history were reviewed and updated as appropriate: allergies, current medications, past family history, past medical history, past social history, past surgical history, and problem list.   Observations/Objective:  currently breastfeeding. Gen App: NAD Psych: normal speech, affect. Good mood.        07/09/2023    3:16 PM 07/01/2023    6:00 PM 12/05/2022    3:29 PM  Edinburgh Postnatal Depression Scale Screening Tool  I have been able to laugh and see the funny side of things. 0 0 0  I have looked forward with enjoyment to things. 0 0 0  I have blamed myself unnecessarily when things went wrong. 0 0 0  I have been anxious or worried for no good reason. 0 2 0  I have felt scared or panicky for no good reason. 0 0 0  Things have been getting on top of me. 0 0 0  I have been so unhappy that I have had difficulty sleeping. 0 0 0  I have felt sad or miserable. 0 0 0  I have been so unhappy that I have been crying. 0 0 0  The thought of harming myself has occurred to me. 0 0 0  Edinburgh Postnatal Depression Scale Total 0 2 0    Assessment and Plan:   1. Encounter for screening for maternal depression - Screening Negative today. Will rescreen at 6 week postpartum visit. Overall doing well.    2. Postpartum state - Overall doing well.  Continue routine postpartum home care.  - BP today 137/79 s/p 5d course of Lasix -- reviewed preeclampsia precautions extensively and should call clinic for any BP >=140/90.  -Still with some mild +1 non-pitting edema, reviewed elevating legs when at rest, getting regular walks to help move fluid, and to stay hydrated.  -Reminder to avoid lifting >10-15 lbs and to practice pelvic rest  Follow up in 4wks for postpartum visit, sooner prn.     Julieanne Manson, DO [facility]

## 2023-07-16 NOTE — Patient Instructions (Signed)
Postpartum Baby Blues The postpartum period begins right after the birth of a baby. During this time, there is often joy and excitement. It is also a time of many changes in the life of the parents. A mother may feel happy one minute and sad or stressed the next. These feelings of sadness, called the baby blues, usually happen in the period right after the baby is born and go away within a week or two. What are the causes? The exact cause of this condition is not known. Changes in hormone levels after childbirth are believed to trigger some of the symptoms. Other factors that can play a role in these mood changes include: Lack of sleep. Stressful life events, such as financial problems, caring for a loved one, or death of a loved one. Genetics. What are the signs or symptoms? Symptoms of this condition include: Changes in mood, such as going from extreme happiness to sadness. A decrease in concentration. Difficulty sleeping. Crying spells and tearfulness. Loss of appetite. Irritability. Anxiety. If these symptoms last for more than 2 weeks or become more severe, you may have postpartum depression. How is this diagnosed? This condition is diagnosed based on an evaluation of your symptoms. Your health care provider may use a screening tool that includes a list of questions to help identify a person with the baby blues or postpartum depression. How is this treated? The baby blues usually go away on their own in 1-2 weeks. Social support is often what is needed. You will be encouraged to get adequate sleep and rest. Follow these instructions at home: Lifestyle     Get as much rest as you can. Take a nap when the baby sleeps. Exercise regularly as told by your health care provider. Some women find yoga and walking to be helpful. Eat a balanced and nourishing diet. This includes plenty of fruits and vegetables, whole grains, and lean proteins. Do little things that you enjoy. Take a bubble  bath, read your favorite magazine, or listen to your favorite music. Avoid alcohol. Ask for help with household chores, cooking, grocery shopping, or running errands. Do not try to do everything yourself. Consider hiring a postpartum doula to help. This is a professional who specializes in providing support to new mothers. Try not to make any major life changes during pregnancy or right after giving birth. This can add stress. General instructions Talk to people close to you about how you are feeling. Get support from your partner, family members, friends, or other new moms. You may want to join a support group. Find ways to manage stress. This may include: Writing your thoughts and feelings in a journal. Spending time outside. Spending time with people who make you laugh. Try to stay positive in how you think. Think about the things you are grateful for. Take over-the-counter and prescription medicines only as told by your health care provider. Let your health care provider know if you have any concerns. Keep all postpartum visits. This is important. Contact a health care provider if: Your baby blues do not go away after 2 weeks. Get help right away if: You have thoughts of taking your own life (suicidal thoughts), or of harming your baby or someone else. You see or hear things that are not there (hallucinations). If you ever feel like you may hurt yourself or others, or have thoughts about taking your own life, get help right away. Go to your nearest emergency department or: Call your local emergency services (911  in the [place] Call a suicide crisis helpline, such as the National Suicide Prevention Lifeline, at 2010651762 or 988 in the [place] This is open 24 hours a day in the [place] Text the Crisis Text Line at 516-247-7780 (in the [place] Summary After giving birth, you may feel happy one minute and sad or stressed the next. Feelings of sadness that happen right after the baby is born and go  away after a week or two are called the baby blues. You can manage the baby blues by getting enough rest, eating a healthy diet, exercising, spending time with supportive people, and finding ways to manage stress. If feelings of sadness and stress last longer than 2 weeks or get in the way of caring for your baby, talk with your health care provider. This may mean you have postpartum depression. This information is not intended to replace advice given to you by your health care provider. Make sure you discuss any questions you have with your health care provider. Document Revised: 02/08/2021 Document Reviewed: 01/08/2020 Elsevier Patient Education  2024 [employer].

## 2023-08-12 NOTE — Progress Notes (Signed)
 OBSTETRICS POSTPARTUM CLINIC PROGRESS NOTE  Subjective:     Jessica Davidson is a 33 y.o. G96P2013 female who presents for a postpartum visit. She is 6 weeks postpartum following a repeat low transverse Cesarean section for h/o previous C-section x 1, di-di twin pregnancy. I have fully reviewed the prenatal and intrapartum course. The delivery was at 37.1 gestational weeks.  Anesthesia: spinal. Postpartum course has been going well. Babies' courses have been good. Baby is feeding by bottle - Similac Pro Total Comfort . Bleeding: patient has resumed menses, with Patient's last menstrual period was 08/11/2022.SABRA Bowel function is normal. Bladder function is normal. Patient is not sexually active. Contraception method desired is OCP (estrogen/progesterone). Postpartum depression screening: negative.  EDPS score is 0.    The following portions of the patient's history were reviewed and updated as appropriate: allergies, current medications, past family history, past medical history, past social history, past surgical history, and problem list.  Review of Systems Pertinent items noted in HPI and remainder of comprehensive ROS otherwise negative.   Objective:    BP 108/68   Pulse 80   Ht 5' 8 (1.727 m)   Wt 238 lb 8 oz (108.2 kg)   LMP 08/11/2022   Breastfeeding No   BMI 36.26 kg/m   General:  alert and no distress   Breasts:  inspection negative, no nipple discharge or bleeding, no masses or nodularity palpable  Lungs: clear to auscultation bilaterally  Heart:  regular rate and rhythm, S1, S2 normal, no murmur, click, rub or gallop  Abdomen: soft, non-tender; bowel sounds normal; no masses,  no organomegaly.  Well healed Pfannenstiel incision   Pelvis:  deferred  Neurologic: Grossly normal.   Extremities: Non-tender, no edema or erythema             08/13/2023    3:49 PM 07/16/2023    4:03 PM 07/09/2023    3:16 PM 07/01/2023    6:00 PM 12/05/2022    3:29 PM  Edinburgh Postnatal  Depression Scale Screening Tool  I have been able to laugh and see the funny side of things. 0 0 0 0 0  I have looked forward with enjoyment to things. 0 0 0 0 0  I have blamed myself unnecessarily when things went wrong. 0 0 0 0 0  I have been anxious or worried for no good reason. 0 0 0 2 0  I have felt scared or panicky for no good reason. 0 0 0 0 0  Things have been getting on top of me. 0 0 0 0 0  I have been so unhappy that I have had difficulty sleeping. 0 0 0 0 0  I have felt sad or miserable. 0 0 0 0 0  I have been so unhappy that I have been crying. 0 0 0 0 0  The thought of harming myself has occurred to me. 0 0 0 0 0  Edinburgh Postnatal Depression Scale Total 0 0 0 2 0     Labs:  Lab Results  Component Value Date   HGB 9.2 (L) 07/02/2023   Results for orders placed or performed in visit on 08/13/23  POCT hemoglobin   Collection Time: 08/13/23  3:43 PM  Result Value Ref Range   Hemoglobin 11.7 11 - 14.6 g/dL      Assessment:   1. Postpartum care following cesarean delivery   2. Anemia, unspecified type   3. Encounter for screening for maternal depression  Plan:   1. Contraception: OCP (estrogen/progesterone). Prescribed Lo Ovral  which she has h/o using in the past. Advised on Sunday start since menses started this week. Use back up method for first week of pills.  2. Check Hgb for h/o postpartum anemia of less than 10. Stable now at 11.7.  3. Follow up in:  4-6  months for annual exam, or sooner as needed.    Archie Savers, MD [facility]

## 2023-08-12 NOTE — Patient Instructions (Signed)

## 2023-08-13 ENCOUNTER — Encounter: Payer: Self-pay | Admitting: Obstetrics and Gynecology

## 2023-08-13 ENCOUNTER — Ambulatory Visit (INDEPENDENT_AMBULATORY_CARE_PROVIDER_SITE_OTHER): Payer: Medicaid Other | Admitting: Obstetrics and Gynecology

## 2023-08-13 DIAGNOSIS — Z1332 Encounter for screening for maternal depression: Secondary | ICD-10-CM | POA: Diagnosis not present

## 2023-08-13 DIAGNOSIS — O9081 Anemia of the puerperium: Secondary | ICD-10-CM

## 2023-08-13 DIAGNOSIS — D649 Anemia, unspecified: Secondary | ICD-10-CM

## 2023-08-13 LAB — POCT HEMOGLOBIN: Hemoglobin: 11.7 g/dL (ref 11–14.6)

## 2023-08-13 MED ORDER — NORGESTREL-ETHINYL ESTRADIOL 0.3-30 MG-MCG PO TABS: 1.0000 | ORAL_TABLET | Freq: Every day | ORAL | 3 refills | Status: AC

## 2023-08-13 NOTE — Addendum Note (Signed)
 Addended by: Fabian November on: 08/13/2023 04:51 PM   Modules accepted: Level of Service
